# Patient Record
Sex: Female | Born: 1990 | Race: Black or African American | Hispanic: No | Marital: Single | Smoking: Never smoker
Health system: Southern US, Community
[De-identification: ages and names within clinical notes are randomized; demographics above are authoritative.]

## PROBLEM LIST (undated history)

## (undated) DIAGNOSIS — E669 Obesity, unspecified: Secondary | ICD-10-CM

## (undated) DIAGNOSIS — J069 Acute upper respiratory infection, unspecified: Secondary | ICD-10-CM

## (undated) HISTORY — PX: WISDOM TOOTH EXTRACTION: SHX21

## (undated) HISTORY — DX: Obesity, unspecified: E66.9

## (undated) HISTORY — DX: Acute upper respiratory infection, unspecified: J06.9

---

## 2011-08-27 ENCOUNTER — Other Ambulatory Visit: Payer: Self-pay | Admitting: Family Medicine

## 2011-08-27 ENCOUNTER — Ambulatory Visit
Admission: RE | Admit: 2011-08-27 | Discharge: 2011-08-27 | Disposition: A | Discharge status: Home / Self Care | Payer: PRIVATE HEALTH INSURANCE | Source: Ambulatory Visit | Attending: Family Medicine | Admitting: Family Medicine

## 2011-08-27 DIAGNOSIS — R109 Unspecified abdominal pain: Secondary | ICD-10-CM

## 2012-12-07 ENCOUNTER — Emergency Department (HOSPITAL_COMMUNITY): Payer: PRIVATE HEALTH INSURANCE

## 2012-12-07 ENCOUNTER — Encounter (HOSPITAL_COMMUNITY): Payer: Self-pay

## 2012-12-07 ENCOUNTER — Emergency Department (HOSPITAL_COMMUNITY)
Admission: EM | Admit: 2012-12-07 | Discharge: 2012-12-07 | Disposition: A | Discharge status: Home / Self Care | Payer: PRIVATE HEALTH INSURANCE | Attending: Emergency Medicine | Admitting: Emergency Medicine

## 2012-12-07 DIAGNOSIS — S99921A Unspecified injury of right foot, initial encounter: Secondary | ICD-10-CM

## 2012-12-07 DIAGNOSIS — S8990XA Unspecified injury of unspecified lower leg, initial encounter: Secondary | ICD-10-CM | POA: Insufficient documentation

## 2012-12-07 DIAGNOSIS — Y9389 Activity, other specified: Secondary | ICD-10-CM | POA: Insufficient documentation

## 2012-12-07 DIAGNOSIS — Y9289 Other specified places as the place of occurrence of the external cause: Secondary | ICD-10-CM | POA: Insufficient documentation

## 2012-12-07 DIAGNOSIS — W230XXA Caught, crushed, jammed, or pinched between moving objects, initial encounter: Secondary | ICD-10-CM | POA: Insufficient documentation

## 2012-12-07 DIAGNOSIS — R229 Localized swelling, mass and lump, unspecified: Secondary | ICD-10-CM | POA: Insufficient documentation

## 2012-12-07 NOTE — ED Notes (Signed)
Pt presents with no acute distress.  Pt c/o rt foot pain.  Pt reports rt foot hit fence last night. Pt has no deformity just slight swelling

## 2012-12-07 NOTE — ED Provider Notes (Signed)
History     CSN: [id number]  Arrival date & time 12/07/12  1157   First MD Initiated Contact with Patient 12/07/12 1211      Chief Complaint  Patient presents with  . Foot Pain  . Foot Injury    (Consider location/radiation/quality/duration/timing/severity/associated sxs/prior treatment) Patient is a 22 y.o. female presenting with lower extremity pain and foot injury. The history is provided by the patient.  Foot Pain This is a new problem. The current episode started yesterday (Pt was leaving through a gated fence and her foot got caught between the gate and fence at the heel). The problem occurs constantly. The problem has been unchanged. Associated symptoms include arthralgias. Pertinent negatives include no chills, diaphoresis, fatigue, fever, joint swelling, nausea, numbness or weakness. The symptoms are aggravated by walking. She has tried ice for the symptoms.  Foot Injury Associated symptoms: no fatigue and no fever     History reviewed. No pertinent past medical history.  Past Surgical History  Procedure Laterality Date  . Wisdom tooth extraction      No family history on file.  History  Substance Use Topics  . Smoking status: Never Smoker   . Smokeless tobacco: Not on file  . Alcohol Use: No    OB History   Grav Para Term Preterm Abortions TAB SAB Ect Mult Living                  Review of Systems  Constitutional: Negative for fever, chills, diaphoresis and fatigue.  Gastrointestinal: Negative for nausea.  Musculoskeletal: Positive for arthralgias. Negative for joint swelling.       Pain to right foot, back of heel  Neurological: Negative for weakness and numbness.    Allergies  Review of patient's allergies indicates no known allergies.  Home Medications  No current outpatient prescriptions on file.  BP 116/71  Pulse 88  Temp(Src) 98.9 F (37.2 C) (Oral)  Resp 18  Wt 270 lb (122.471 kg)  SpO2 100%  LMP 11/29/2012  Physical Exam  Nursing  note and vitals reviewed. Constitutional: She is oriented to person, place, and time. She appears well-developed and well-nourished. No distress.  HENT:  Head: Normocephalic and atraumatic.  Eyes: Conjunctivae and EOM are normal.  Neck: Normal range of motion. Neck supple.  No meningeal signs  Cardiovascular: Normal rate, regular rhythm, normal heart sounds and intact distal pulses.  Exam reveals no gallop and no friction rub.   No murmur heard. Pulmonary/Chest: Effort normal and breath sounds normal. No respiratory distress. She has no wheezes. She has no rales. She exhibits no tenderness.  Abdominal: Soft. Bowel sounds are normal. She exhibits no distension. There is no tenderness. There is no rebound and no guarding.  Musculoskeletal: Normal range of motion. She exhibits no edema and no tenderness.  FROM to right ankle including plantar flexion and dorsiflexion.     Neurological: She is alert and oriented to person, place, and time. No cranial nerve deficit.  Sensation normal to light touch Normal gait and balance Normal strength in injured extremity  Skin: Skin is warm and dry. She is not diaphoretic. No erythema.  Swelling to right achilles   Psychiatric: She has a normal mood and affect.    ED Course  Procedures (including critical care time)  Labs Reviewed - No data to display Dg Ankle Complete Right  12/07/2012   *RADIOLOGY REPORT*  Clinical Data: Right ankle pain.  RIGHT ANKLE - COMPLETE 3+ VIEW  Comparison: No priors.  Findings: Extensive soft tissue swelling is noted overlying the lateral malleolus.  No underlying acute displaced fracture, subluxation or dislocation is noted.  IMPRESSION: 1.  Soft tissue swelling overlying the lateral malleolus without definite underlying bony trauma.   Original Report Authenticated By: Trudie Reed, M.D.   Dg Foot Complete Right  12/07/2012   *RADIOLOGY REPORT*  Clinical Data: Foot pain post injury  RIGHT FOOT COMPLETE - 3+ VIEW   Comparison: None.  Findings: Three views of the right foot submitted.  No acute fracture or subluxation.  No radiopaque foreign body.  IMPRESSION: No acute fracture or subluxation.   Original Report Authenticated By: Natasha Mead, M.D.     1. Right foot injury, initial encounter       MDM  Imaging shows no fracture. Directed pt to ice injury, take acetaminophen or ibuprofen for pain, and to elevate and rest the injury when possible. Discussed reasons to seek immediate care. Patient expresses understanding and agrees with plan.    Glade Nurse, PA-C 12/07/12 1637

## 2012-12-08 NOTE — ED Provider Notes (Signed)
Medical screening examination/treatment/procedure(s) were performed by non-physician practitioner and as supervising physician I was immediately available for consultation/collaboration.   Charles B. Sheldon, MD 12/08/12 0700 

## 2013-09-09 IMAGING — CT CT ABD-PELV W/O CM
3 of 4 series · 13 of 36 positions shown, 19 images · non-contrast
Comparison: None.

CLINICAL DATA: Right flank pain for 2 months.  Microhematuria.
789.00.

CT ABDOMEN AND PELVIS WITHOUT CONTRAST
TECHNIQUE: Multidetector CT imaging of the abdomen and pelvis was
performed following the standard protocol without intravenous
contrast.

[Series 3: renal stone · axial · 0.91mm/px · z∈[-326,-20]mm · 7 of 83 slices shown, 12 images]
[im 11/83  soft-tissue]
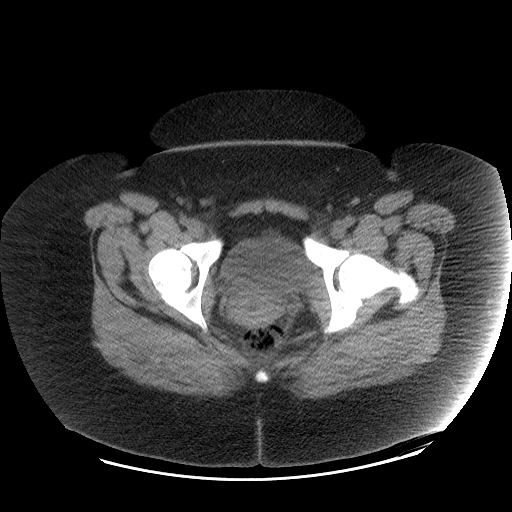
[im 11/83  bone]
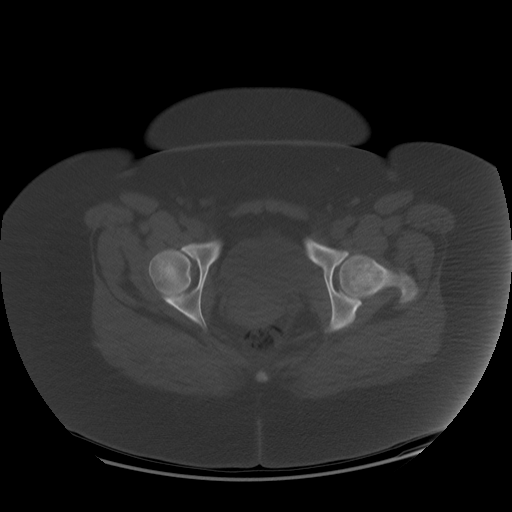
[im 21/83  soft-tissue]
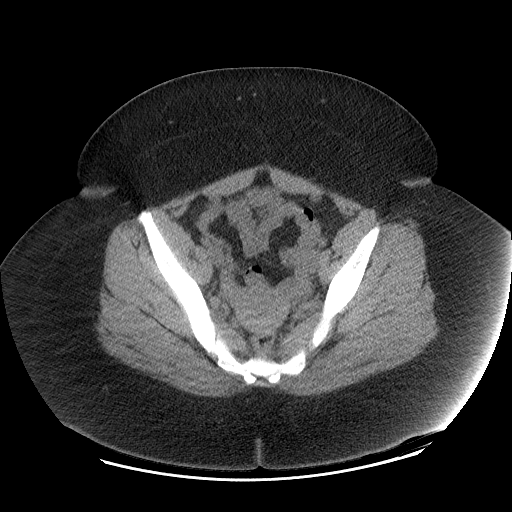
[im 31/83  soft-tissue]
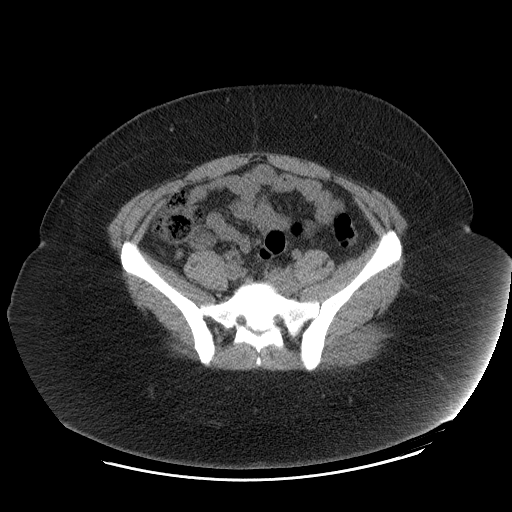
[im 42/83  soft-tissue]
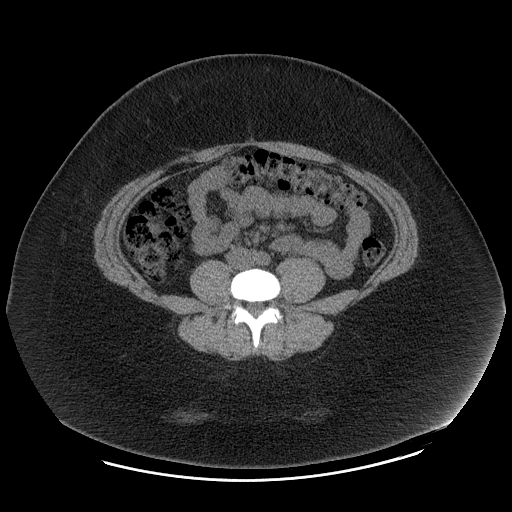
[im 42/83  lung]
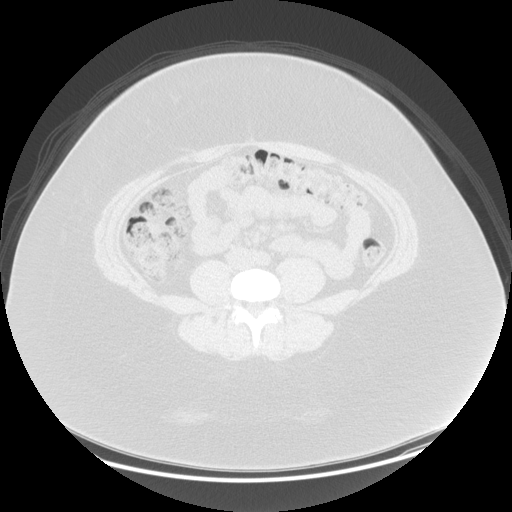
[im 52/83  soft-tissue]
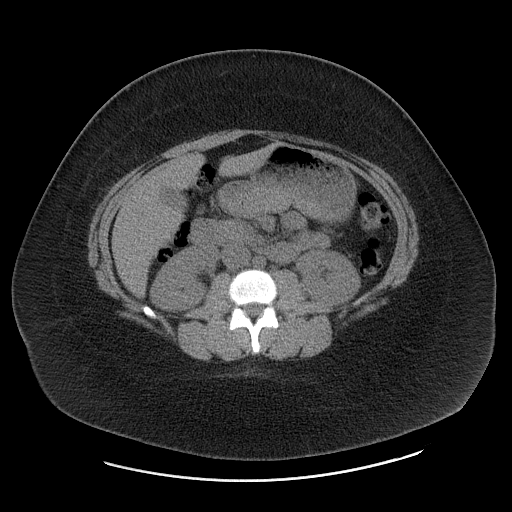
[im 52/83  lung]
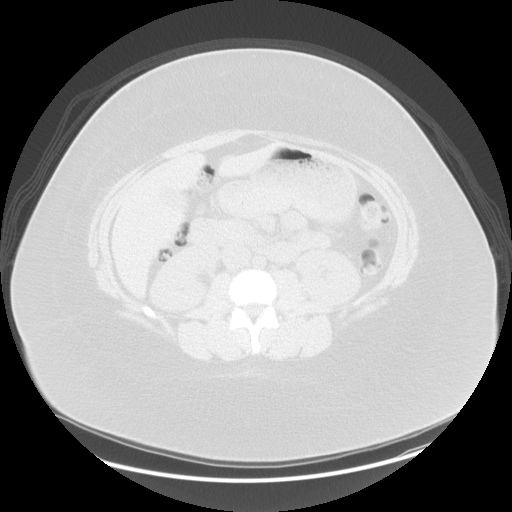
[im 62/83  soft-tissue]
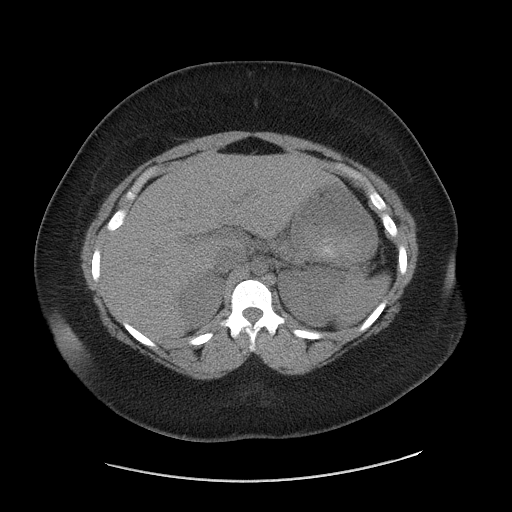
[im 62/83  lung]
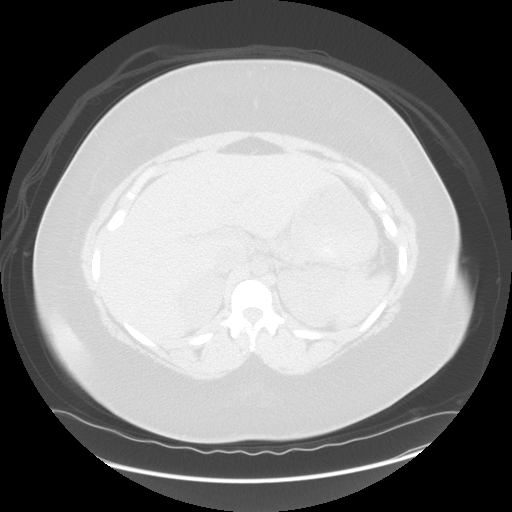
[im 72/83  soft-tissue]
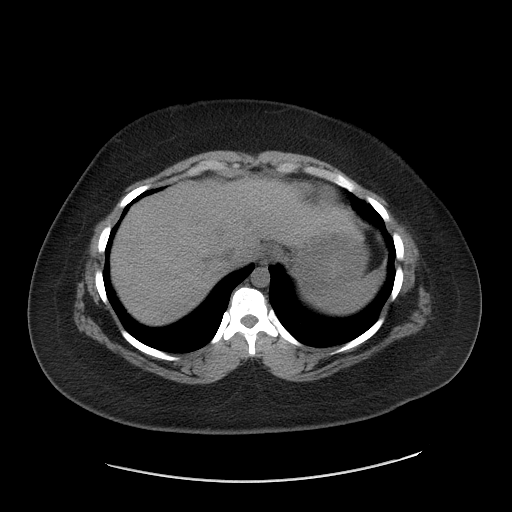
[im 72/83  lung]
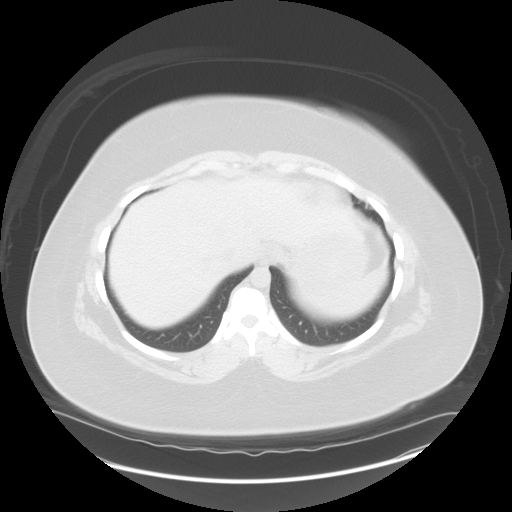

[Series 601: coronal body · coronal · 0.91mm/px · 1 of 156 slices shown, 2 images]
[im 52/156  soft-tissue]
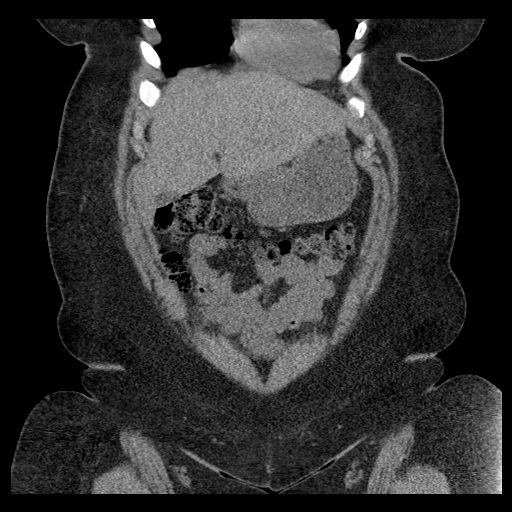
[im 52/156  bone]
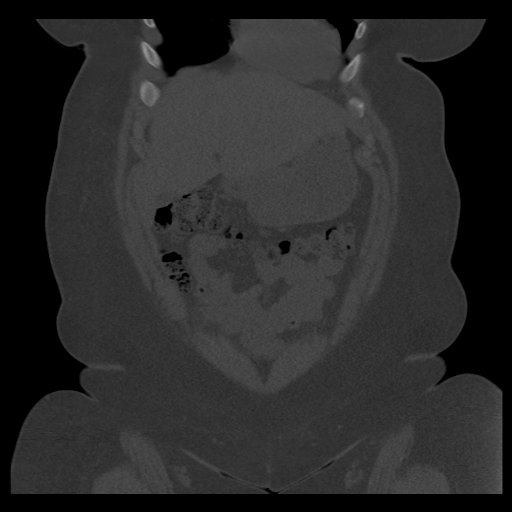

[Series 602: sagittal body · sagittal · 0.91mm/px · 5 of 187 slices shown]
[im 20/187  soft-tissue]
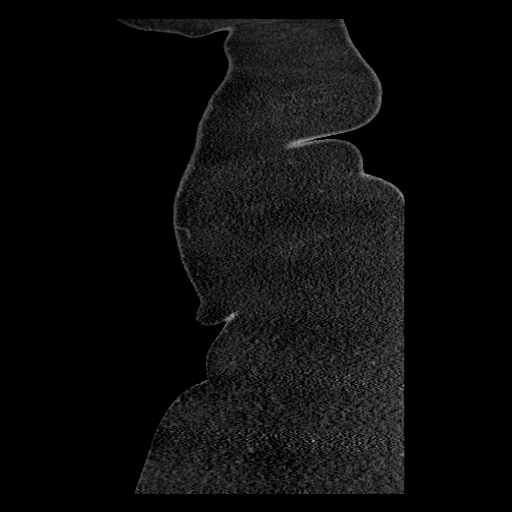
[im 40/187  soft-tissue]
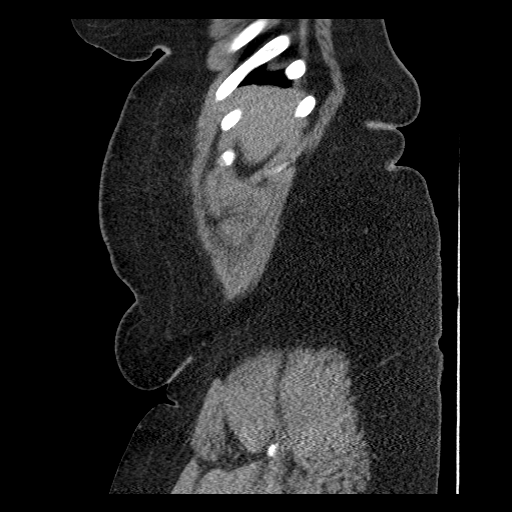
[im 59/187  soft-tissue]
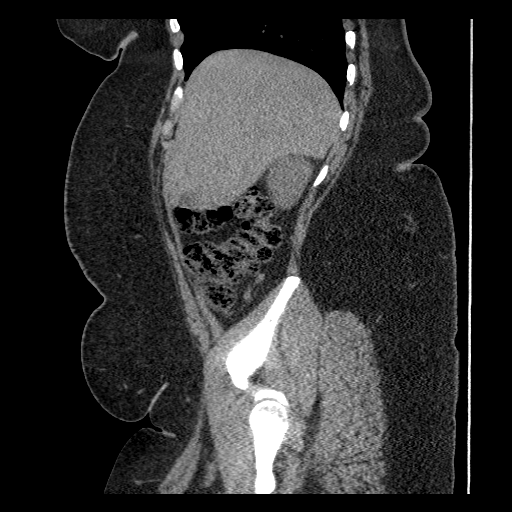
[im 79/187  soft-tissue]
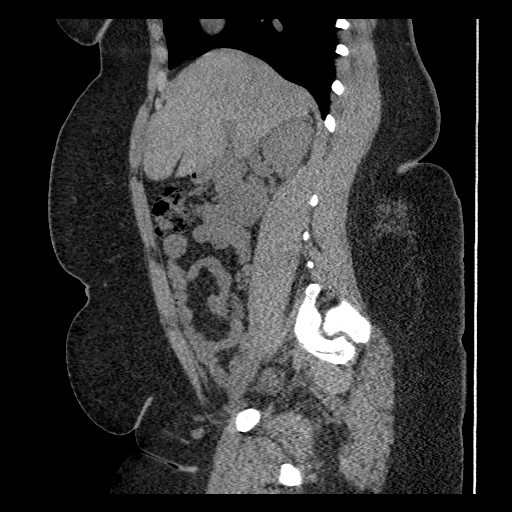
[im 108/187  soft-tissue]
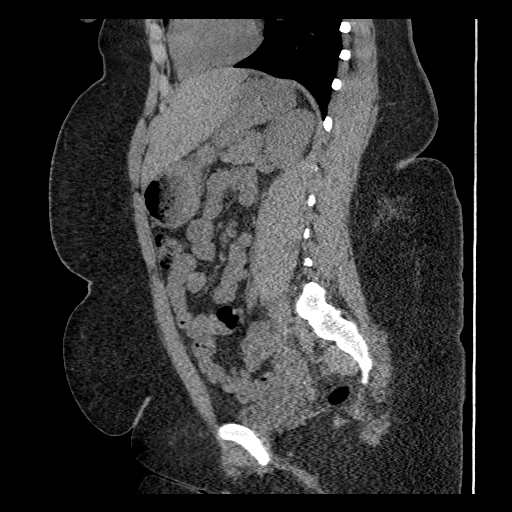

[13 of 36 positions shown; findings below may reference images not displayed]

FINDINGS: The kidneys are normal with no renal or ureteral calculi
or hydronephrosis.

The liver, spleen, pancreas, and adrenal glands are normal.  The
bowel is normal including the terminal ileum and appendix.  Uterus
is normal in size.  Ovaries are not discretely identified but there
is no evidence of a pelvic mass.  There is no free air or free
fluid in the abdomen.  Bones are normal.  Bladder is not distended.
IMPRESSION: Benign-appearing abdomen and pelvis.

## 2014-11-19 ENCOUNTER — Encounter (HOSPITAL_COMMUNITY): Payer: Self-pay | Admitting: Emergency Medicine

## 2014-11-19 ENCOUNTER — Emergency Department (HOSPITAL_COMMUNITY)
Admission: EM | Admit: 2014-11-19 | Discharge: 2014-11-19 | Disposition: A | Discharge status: Home / Self Care | Payer: PRIVATE HEALTH INSURANCE | Attending: Emergency Medicine | Admitting: Emergency Medicine

## 2014-11-19 DIAGNOSIS — Z791 Long term (current) use of non-steroidal anti-inflammatories (NSAID): Secondary | ICD-10-CM | POA: Insufficient documentation

## 2014-11-19 DIAGNOSIS — M542 Cervicalgia: Secondary | ICD-10-CM | POA: Diagnosis present

## 2014-11-19 DIAGNOSIS — M436 Torticollis: Secondary | ICD-10-CM | POA: Insufficient documentation

## 2014-11-19 MED ORDER — TRAMADOL HCL 50 MG PO TABS: 50.0000 mg | ORAL_TABLET | Freq: Four times a day (QID) | ORAL | Status: DC | PRN

## 2014-11-19 MED ORDER — IBUPROFEN 800 MG PO TABS: 800.0000 mg | ORAL_TABLET | Freq: Three times a day (TID) | ORAL | Status: DC

## 2014-11-19 NOTE — ED Notes (Signed)
Pt woke up two weeks ago with a "crick" in her neck. Stated she went to urgent care, got a shot of prednisone that helped for a short time. Now the pain in her neck has come back. Denies any injury to area.

## 2014-11-19 NOTE — Discharge Instructions (Signed)
Torticollis, Acute °You have suddenly (acutely) developed a twisted neck (torticollis). This is usually a self-limited condition. °CAUSES  °Acute torticollis may be caused by malposition, trauma or infection. Most commonly, acute torticollis is caused by sleeping in an awkward position. Torticollis may also be caused by the flexion, extension or twisting of the neck muscles beyond their normal position. Sometimes, the exact cause may not be known. °SYMPTOMS  °Usually, there is pain and limited movement of the neck. Your neck may twist to one side. °DIAGNOSIS  °The diagnosis is often made by physical examination. X-rays, CT scans or MRIs may be done if there is a history of trauma or concern of infection. °TREATMENT  °For a common, stiff neck that develops during sleep, treatment is focused on relaxing the contracted neck muscle. Medications (including shots) may be used to treat the problem. Most cases resolve in several days. Torticollis usually responds to conservative physical therapy. If left untreated, the shortened and spastic neck muscle can cause deformities in the face and neck. Rarely, surgery is required. °HOME CARE INSTRUCTIONS  °· Use over-the-counter and prescription medications as directed by your caregiver. °· Do stretching exercises and massage the neck as directed by your caregiver. °· Follow up with physical therapy if needed and as directed by your caregiver. °SEEK IMMEDIATE MEDICAL CARE IF:  °· You develop difficulty breathing or noisy breathing (stridor). °· You drool, develop trouble swallowing or have pain with swallowing. °· You develop numbness or weakness in the hands or feet. °· You have changes in speech or vision. °· You have problems with urination or bowel movements. °· You have difficulty walking. °· You have a fever. °· You have increased pain. °MAKE SURE YOU:  °· Understand these instructions. °· Will watch your condition. °· Will get help right away if you are not doing well or  get worse. °Document Released: 07/03/2000 Document Revised: 09/28/2011 Document Reviewed: 08/14/2009 °ExitCare® Patient Information ©2015 [employer]. This information is not intended to replace advice given to you by your health care provider. Make sure you discuss any questions you have with your health care provider. ° ° °Emergency Department Resource Guide °1) Find a Doctor and Pay Out of Pocket °Although you won't have to find out who is covered by your insurance plan, it is a good idea to ask around and get recommendations. You will then need to call the office and see if the doctor you have chosen will accept you as a new patient and what types of options they offer for patients who are self-pay. Some doctors offer discounts or will set up payment plans for their patients who do not have insurance, but you will need to ask so you aren't surprised when you get to your appointment. ° °2) Contact Your Local Health Department °Not all health departments have doctors that can see patients for sick visits, but many do, so it is worth a call to see if yours does. If you don't know where your local health department is, you can check in your phone book. The CDC also has a tool to help you locate your state's health department, and many state websites also have listings of all of their local health departments. ° °3) Find a Walk-in Clinic °If your illness is not likely to be very severe or complicated, you may want to try a walk in clinic. These are popping up all over the country in pharmacies, drugstores, and shopping centers. They're usually staffed by nurse practitioners or   physician assistants that have been trained to treat common illnesses and complaints. They're usually fairly quick and inexpensive. However, if you have serious medical issues or chronic medical problems, these are probably not your best option. ° °No Primary Care Doctor: °- Call [facility] at  832-8000 - they can help you locate a  primary care doctor that  accepts your insurance, provides certain services, etc. °- Physician Referral Service- 1-800-533-3463 ° °Chronic Pain Problems: °Organization         Address  Phone   Notes  °[facility]  (336) 297-2271 Patients need to be referred by their primary care doctor.  ° °Medication Assistance: °Organization         Address  Phone   Notes  °[place] [facility] [address] °[place], [place] [address] (336) 641-8030 --Must be a resident of [place] °-- Must have NO insurance coverage whatsoever (no Medicaid/ Medicare, etc.) °-- The pt. MUST have a primary care doctor that directs their care regularly and follows them in the community °  °[facility]  (866) 331-1348   °[employer]  (888) 892-1162   ° °Agencies that provide inexpensive medical care: °Organization         Address  Phone   Notes  °[facility]  (336) 832-8035   °[facility]    (336) 832-7272   °[facility] [address] °[place], [place] [address] (336) 832-4777   °[facility] [address], °[place] (336) 271-4999   °[facility]    (336) 373-0678   °[facility]    (336) 272-1050   °[facility] ° [address], [place] Phone:  (336) 832-4444, Fax:  (336) 832-4440 Hours of Operation:  9 am - 6 pm, M-F.  Also accepts Medicaid/Medicare and self-pay.  °[facility] ° [address], [place] Phone: (336) 832-3150, Fax: (336) 832-3151. Hours of Operation:  8:30 am - 5:30 pm, M-F.  Also accepts Medicaid and self-pay.  °[facility] [place] [address], [place] Phone: (336) 878-6027   °[facility] [address], [place], [place] (336)723-1848, Ext. 123 Mondays & Thursdays: 7-9 AM.  First 15 patients are seen on a first come, first serve basis. °  ° °Medicaid-accepting [place]  Providers: ° °Organization         Address  Phone   Notes  °[facility] [address], [place] (336) 641-2100 Also accepts self-pay patients.  °[facility] [address], [place] ° (336) 856-9996   °[facility] [address], [place] (336) 288-8857   °[facility] [address] [place] [address], [place] (336) 299-7000   °Veita Bland [address], [place]  ° (336) 373-1557 Only accepts [place] Access Medicaid patients after they have their name applied to their card.  ° °Self-Pay (no insurance) in [place]: ° °Organization         Address  Phone   Notes  °Sickle Cell Patients, [facility] [address], [place] (336) 832-1970   °[facility] [address],  (336) 832-4400   °[facility] ° [address], [place] (336) 992-4800   °[facility]/Dr. Osei-Bonsu ° [address] [place] [address], [place] or [address], [place] (336) [phone] Phone number for both [place] and [place] locations is the same.  °[facility] [address], [place] (336) 299-0000   °[facility] [address] [place] [address], [place] or [address] (336) 852-7530 °(336) 878-2260   °[facility] [address], [place] (336) 350-1642, phone; (336) 294-5005, fax Sees patients 1st and 3rd Saturday of every month.  Must not qualify for public or private insurance (i.e. Medicaid, Medicare, [place] Health Choice, Veterans' Benefits) • Household income should be no more than 200% of the poverty level •The clinic cannot treat you if you are pregnant or think you are pregnant • Sexually transmitted diseases are not treated at the clinic.  ° ° °Dental Care: °Organization         Address  Phone  Notes  °[facility] [address], [place] (336) 641-6152 Accepts children up to age 21 who are enrolled in Medicaid or [place] Health Choice; pregnant women with a Medicaid card; and children who have applied for Medicaid or [place] Health Choice, but were declined, whose parents can pay a reduced fee at time of service.  °[facility] [place]  [address], [place] (336) 641-7733 Accepts children up to age 21 who are enrolled in Medicaid or [place] Health Choice; pregnant women with a Medicaid card; and children who have applied for Medicaid or [place] Health Choice, but were declined, whose parents can pay a reduced fee at time of service.  °[facility] ° [address], [place] (336) 641-4533 Patients are seen by appointment only. Walk-ins are not accepted. [facility] will see patients 18 years of age and older. °Monday - Tuesday (8am-5pm) °Most Wednesdays (8:30-5pm) °$30 per visit, cash only  °[facility] ° [address], [place] (336) 641-4533 Patients are seen by appointment only. Walk-ins are not accepted. [facility] will see patients 18 years of age and older. °One Wednesday Evening (Monthly: Volunteer Based).  $30 per visit, cash only  °[facility]  (919) 537-3737 for adults; Children under age 4, call Graduate Pediatric Dentistry at (919) [phone]. Children aged 4-14, please call (919) [phone] to request a pediatric application. ° Dental services are provided in all areas of dental care including fillings, crowns and bridges, complete and partial dentures, implants, gum treatment, root canals, and extractions. Preventive care is also provided. Treatment is provided to both adults and children. °Patients are selected via a lottery and there is often a waiting list. °  °[facility] [address], ° ° (336) 763-8833 [email or web address] °  °[facility]  [address], [place], [place] (336)723-1848, Ext. 123 Second and Fourth Thursday of each month, opens at 6:30 AM; Clinic ends at 9 AM.  Patients are seen on a first-come first-served basis, and a limited number are seen during each clinic.  ° °[facility] ° [address], [place], [place] (336) 723-7904   Eligibility Requirements °You must have lived in [place], [place], or [place] counties for at least the last three months. °  You cannot be eligible for state or federal sponsored healthcare insurance, including Veterans Administration, Medicaid, or Medicare. °  You generally cannot be eligible for healthcare insurance through your employer.  °  How   to apply: °Eligibility screenings are held every Tuesday and Wednesday afternoon from 1:00 pm until 4:00 pm. You do not need an appointment for the interview!  °[facility] [address], [place], [place] 336-631-2330   °[facility]  336-342-8273   °[facility]  336-703-3100   °[facility]  336-570-6415   ° °Behavioral Health Resources in the Community: °Intensive Outpatient Programs °Organization         Address  Phone  Notes  °[facility] [address], [place], [place] 336-878-6098   °[facility] [address], [place], [place] 336-832-9800   °[facility] [address], [place], [place] ° 336-882-2125   °[facility] [address],  °[place], [place] 1-800-853-5163 or 336-641-4981   °Substance Abuse Resources °Organization         Address  Phone  Notes  °[facility]  336-882-2125   °[facility]  336-784-9470   °[facility]  336-285-9073   °[facility]  336-845-3988   °Residential & Outpatient Substance Abuse Program  1-800-659-3381   °Psychological Services °Organization         Address  Phone  Notes  °[facility]  336- 832-9600    °[facility]  336- 378-7881   °[facility] [address], [place] 1-800-853-5163 or 336-641-4981   ° °Mobile Crisis Teams °Organization         Address  Phone  Notes  °[facility], Mobile Crisis Care Unit  1-877-626-1772   °Assertive °[facility] ° [address] [place], [place] 336-834-9664   °[facility] DeEsch [address] °[place] 336-554-5454   ° °Self-Help/Support Groups °Organization         Address  Phone             Notes  °[facility] - variety of support groups  336- 373-1402 Call for more information  °Narcotics Anonymous (NA), [facility] [address], °[place]  2 meetings at this location  ° °Residential Treatment Programs °Organization         Address  Phone  Notes  °[facility] [address],    °[place]  1-866-801-8205   °[facility] ° [address], [place], [place] 704-293-8524   °[facility] [address], [place] 336-845-3988 Admissions: 8am-3pm M-F  °[facility] [address]    °[place], [place] 336-841-1104   °[facility] [address], [place], [place] 336-379-7146   °[facility] [address]  °[place], [place] 336-285-9073   °[facility] - Intensive Outpatient [address], [place], [place] 336-852-3033   °[facility] [address]  °[place], [place] 1-877-615-2722 or 336-784-9470   °[facility] [address] [place], [place] 336-227-7417 Accepts Medicaid  °[facility] [address]  °[place] 1-800-659-3381 Substance Abuse/Addiction Treatment  ° °[place] Behavioral Health Resources °Organization         Address  Phone  Notes  °[facility]  (888) 581-9988   °Julie Brannon, PhD [address] [place], [place]   (336) 349-5553 or (336) 951-0000    ° Behavioral   [address] °[place],  (336) 349-4454   °[facility] [address],   [place], [place] (336) 342-8316 Insurance/Medicaid/sponsorship through [facility]  °[facility] [address]                                    [place], [place] (336) 342-8316 Therapy/tele-psych/case  °[facility] [address]  ° [place], [place] (336) 349-2233    °Dr. Arfeen  (336) 349-4544   °[facility] [place]  [employer] [place] [facility] 1) [address], [place] °2) [address], [place] °3)  [address], [place] (336) 349-3220 °(336) 342-7768 ° °(336) 342-8140   °[place] [facility] (336) 342-1394 or (336) 342-3537 (After Hours)    ° ° ° °

## 2014-11-19 NOTE — ED Provider Notes (Signed)
CSN: [id number]     Arrival date & time 11/19/14  1105 History   First MD Initiated Contact with Patient 11/19/14 1120     Chief Complaint  Patient presents with  . Neck Pain     (Consider location/radiation/quality/duration/timing/severity/associated sxs/prior Treatment) HPI Sonya Barton is a 24 year old female who presents the ER complaining of neck pain 2 weeks. Patient states she "slept funny" 2 weeks ago, and has since been having a tightness in the right side of her neck. Patient states she went to urgent care several days ago, received a shot of prednisone, which helped her pain. Patient states her neck has loose and somewhat, however she still having pain on that side of her neck. Patient states she is only attempted using 200 mg of ibuprofen per day for this pain. Patient denies fever, headache, blurred vision, dizziness, weakness, tingling sensation.  History reviewed. No pertinent past medical history. Past Surgical History  Procedure Laterality Date  . Wisdom tooth extraction     History reviewed. No pertinent family history. History  Substance Use Topics  . Smoking status: Never Smoker   . Smokeless tobacco: Not on file  . Alcohol Use: No   OB History    No data available     Review of Systems  Constitutional: Negative for fever.  Eyes: Negative for visual disturbance.  Respiratory: Negative for shortness of breath.   Cardiovascular: Negative for chest pain.  Gastrointestinal: Negative for nausea, vomiting and abdominal pain.  Genitourinary: Negative for dysuria.  Musculoskeletal: Positive for neck pain.  Skin: Negative for rash.  Neurological: Negative for dizziness, syncope, weakness and numbness.  Psychiatric/Behavioral: Negative.       Allergies  Review of patient's allergies indicates no known allergies.  Home Medications   Prior to Admission medications   Medication Sig Start Date End Date Taking? Authorizing Provider  ibuprofen (ADVIL,MOTRIN)  800 MG tablet Take 1 tablet (800 mg total) by mouth 3 (three) times daily. 11/19/14   Ladona Mow, PA-C  traMADol (ULTRAM) 50 MG tablet Take 1 tablet (50 mg total) by mouth every 6 (six) hours as needed. 11/19/14   Ladona Mow, PA-C   BP 129/73 mmHg  Pulse 85  Temp(Src) 98.4 F (36.9 C) (Oral)  Resp 18  SpO2 100% Physical Exam  Constitutional: She is oriented to person, place, and time. She appears well-developed and well-nourished. No distress.  HENT:  Head: Normocephalic and atraumatic.  Right Ear: Tympanic membrane normal.  Left Ear: Tympanic membrane normal.  Mouth/Throat: Uvula is midline, oropharynx is clear and moist and mucous membranes are normal. No oropharyngeal exudate, posterior oropharyngeal edema, posterior oropharyngeal erythema or tonsillar abscesses.  Eyes: Conjunctivae and EOM are normal. Pupils are equal, round, and reactive to light. Right eye exhibits no discharge. Left eye exhibits no discharge. No scleral icterus.  Neck: Trachea normal, normal range of motion and full passive range of motion without pain. Neck supple. No tracheal tenderness, no spinous process tenderness and no muscular tenderness present. No rigidity. No tracheal deviation, no edema, no erythema and normal range of motion present. No Brudzinski's sign and no Kernig's sign noted.    Pulmonary/Chest: Effort normal. No stridor. No respiratory distress.  Musculoskeletal: Normal range of motion.  Neurological: She is alert and oriented to person, place, and time. She has normal strength. No cranial nerve deficit or sensory deficit. She displays a negative Romberg sign. Coordination and gait normal. GCS eye subscore is 4. GCS verbal subscore is 5. GCS motor subscore is  6.  Patient fully alert, answering questions appropriately in full, clear sentences. Cranial nerves II through XII grossly intact. Motor strength 5 out of 5 in all major muscle groups of upper and lower extremities. Distal sensation intact.   Skin:  Skin is warm and dry. She is not diaphoretic.  Psychiatric: She has a normal mood and affect.  Nursing note and vitals reviewed.   ED Course  Procedures (including critical care time) Labs Review Labs Reviewed - No data to display  Imaging Review No results found.   EKG Interpretation None      MDM   Final diagnoses:  Torticollis    Patient here with some symptoms consistent with torticollis. Patient has not failed conservative therapies based on her description of what she has been trying to help with her discomfort. Neuro exam is benign. There is no concern for spinal injury, cervical radiculopathy. There are no meningeal signs and there is no concern for meningitis. We'll encourage the continuation of conservative therapies, strongly encouraged follow-up with a primary care physician, encouraged range of motion exercises. Discussed return precautions with patient, patient verbalizes understanding and agreement of this plan. Encouraged patient to call or return to the ER if any worsening symptoms or should she have any questions or concerns.  BP 129/73 mmHg  Pulse 85  Temp(Src) 98.4 F (36.9 C) (Oral)  Resp 18  SpO2 100%  Signed,  Ladona MowJoe Surena Welge, PA-C 11:40 AM     Ladona MowJoe Makya Phillis, PA-C 11/19/14 1141  Arby BarretteMarcy Pfeiffer, MD 11/22/14 [phone]

## 2014-12-21 IMAGING — CR DG ANKLE COMPLETE 3+V*R*
3 series · 3 of 3 positions shown · non-contrast
Comparison: No priors.

CLINICAL DATA: Right ankle pain.

RIGHT ANKLE - COMPLETE 3+ VIEW

[x ankle ap right]
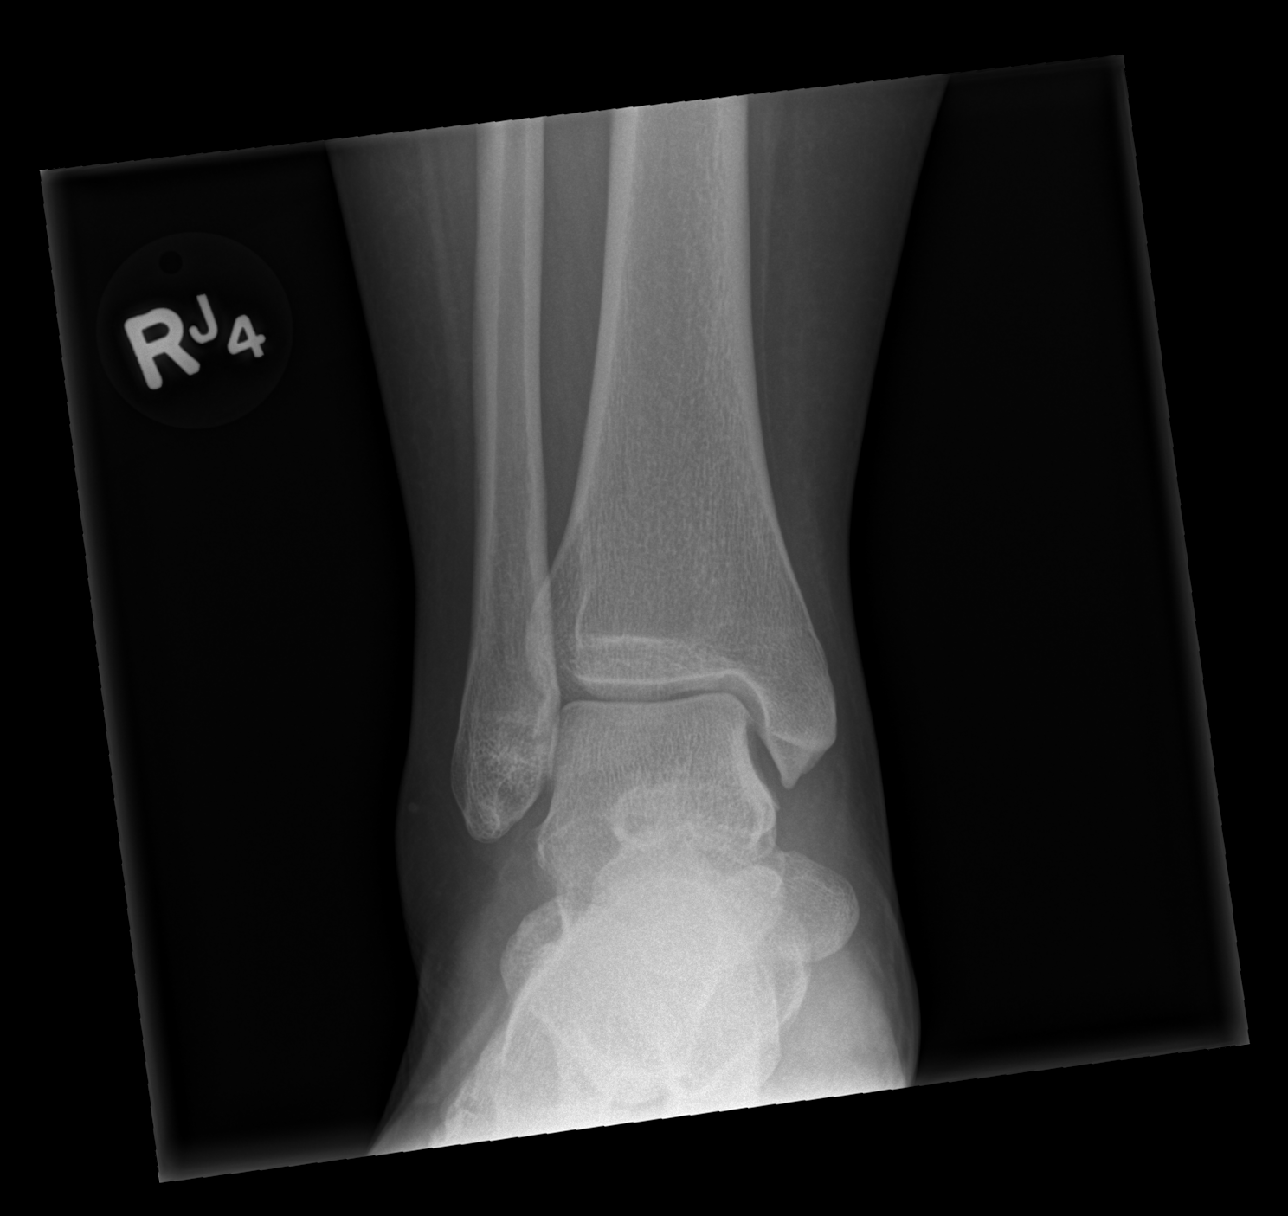

[x ankle obl right]
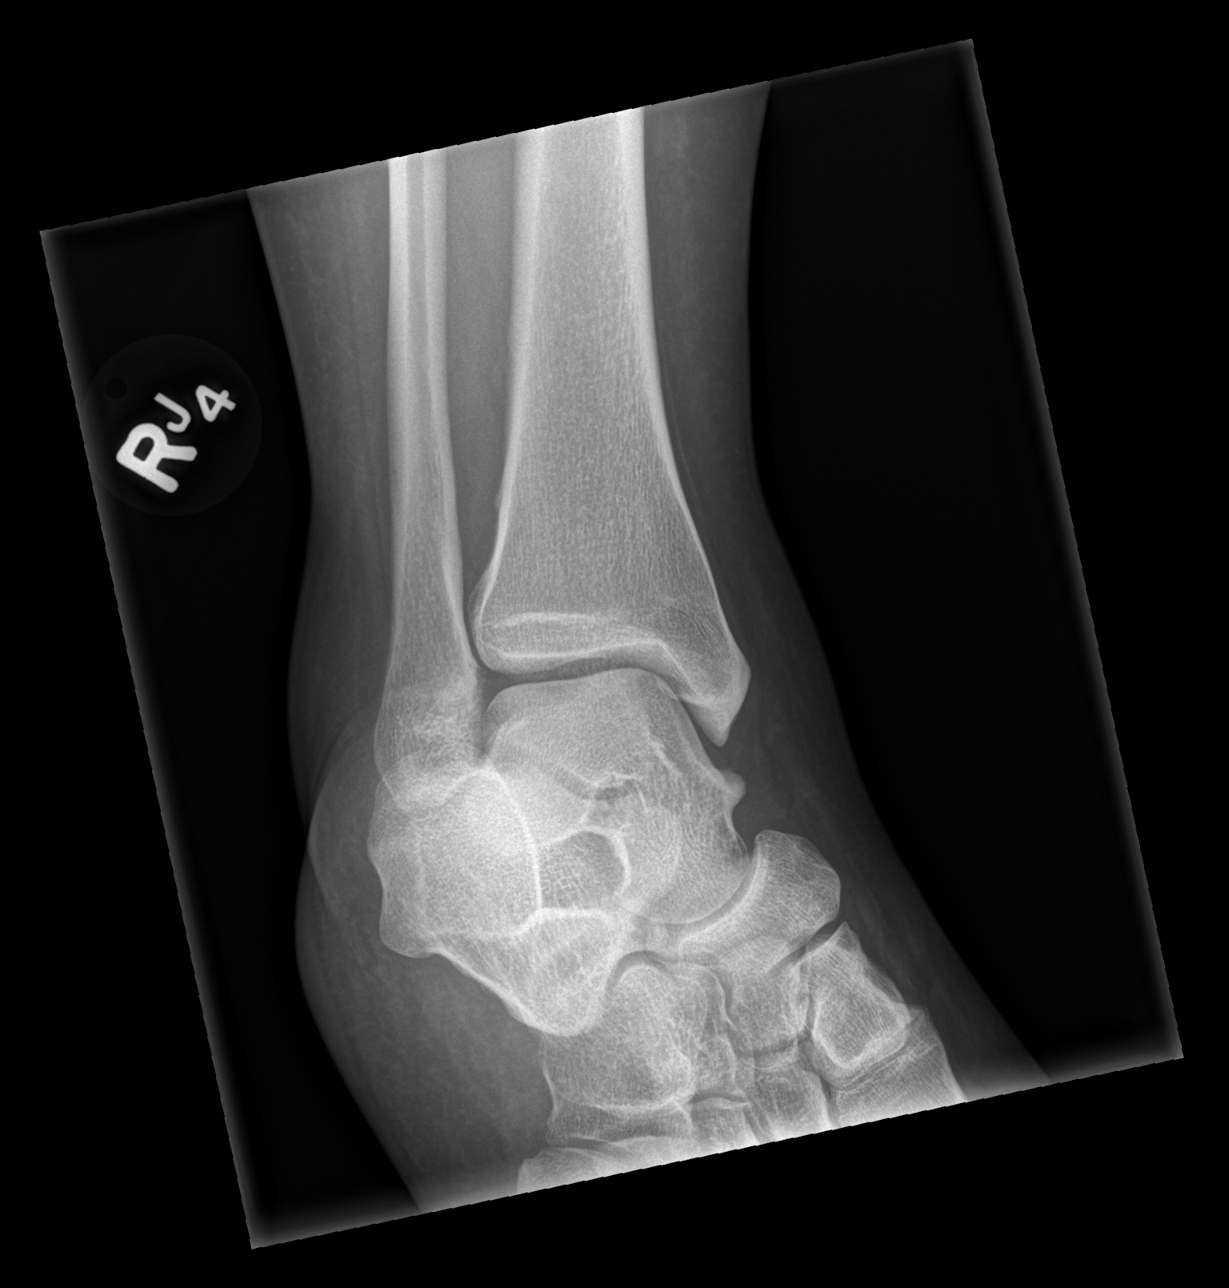

[x ankle lat right]
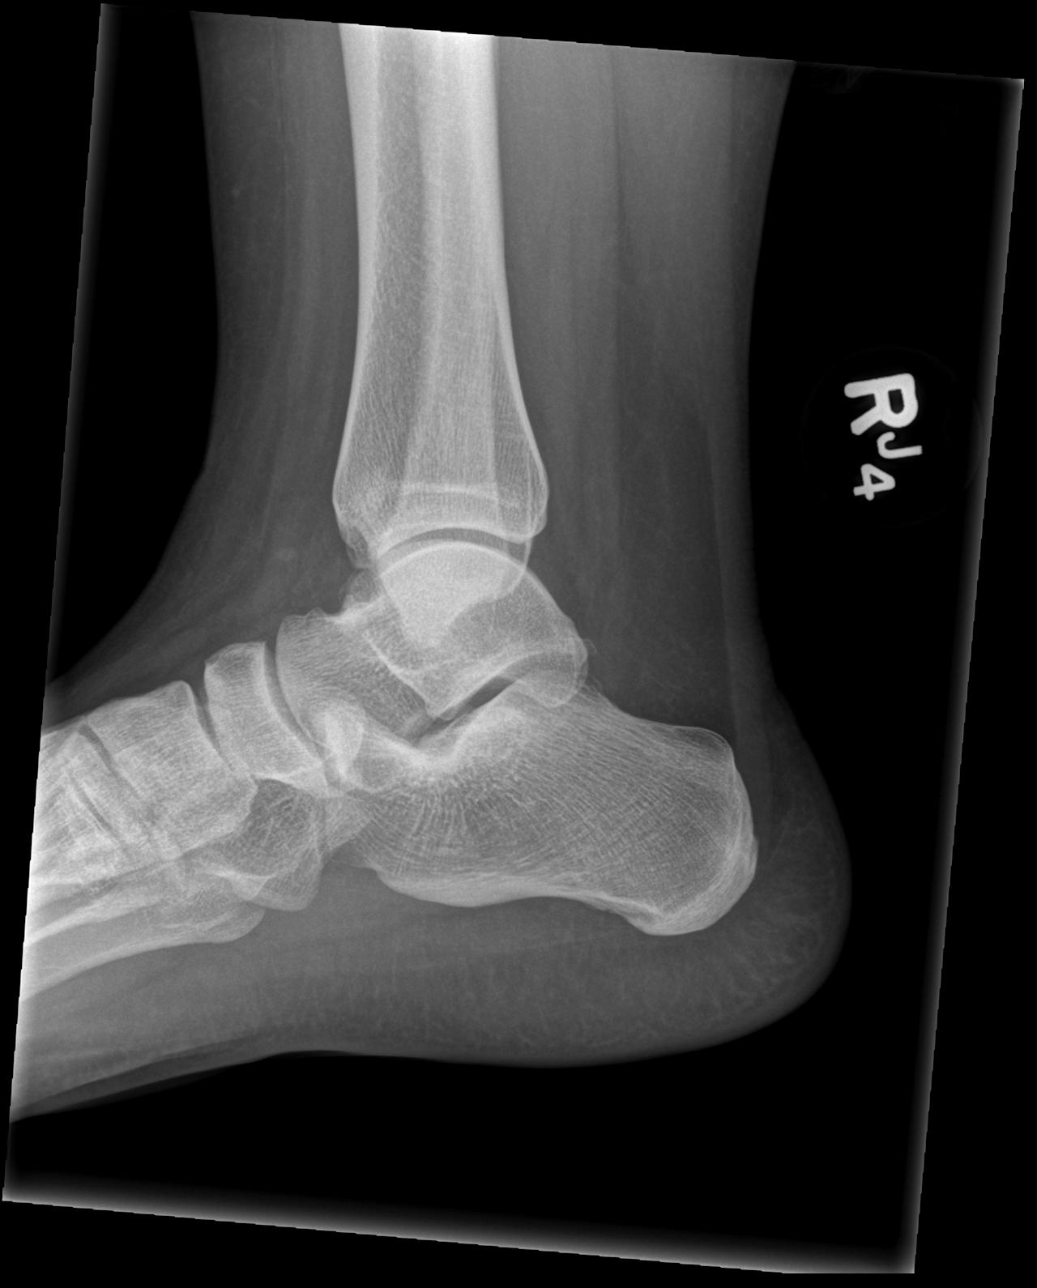

[3 of 3 positions shown; findings below may reference images not displayed]

FINDINGS: Extensive soft tissue swelling is noted overlying the
lateral malleolus.  No underlying acute displaced fracture,
subluxation or dislocation is noted.
IMPRESSION: 1.  Soft tissue swelling overlying the lateral malleolus without
definite underlying bony trauma.

## 2015-10-30 ENCOUNTER — Encounter (HOSPITAL_COMMUNITY): Payer: Self-pay | Admitting: *Deleted

## 2015-10-30 ENCOUNTER — Emergency Department (HOSPITAL_COMMUNITY)
Admission: EM | Admit: 2015-10-30 | Discharge: 2015-10-30 | Disposition: A | Discharge status: Home / Self Care | Payer: PRIVATE HEALTH INSURANCE | Attending: Emergency Medicine | Admitting: Emergency Medicine

## 2015-10-30 DIAGNOSIS — J029 Acute pharyngitis, unspecified: Secondary | ICD-10-CM | POA: Insufficient documentation

## 2015-10-30 DIAGNOSIS — Z791 Long term (current) use of non-steroidal anti-inflammatories (NSAID): Secondary | ICD-10-CM | POA: Insufficient documentation

## 2015-10-30 MED ORDER — PENICILLIN G BENZATHINE 1200000 UNIT/2ML IM SUSP
1.2000 10*6.[IU] | Freq: Once | INTRAMUSCULAR | Status: AC
  Administered 2015-10-30: 1.2 10*6.[IU] via INTRAMUSCULAR
  Filled 2015-10-30: qty 2

## 2015-10-30 NOTE — Discharge Instructions (Signed)
May use salt water gargles and/or chloraseptic spray to help with throat pain. May also take tylenol or motrin if needed for pain or fever. Return here for any new/worsening symptoms.

## 2015-10-30 NOTE — ED Notes (Signed)
C/O sore throat and cough x 2days.

## 2015-10-30 NOTE — ED Provider Notes (Signed)
CSN: [id number]     Arrival date & time 10/30/15  1111 History   First MD Initiated Contact with Patient 10/30/15 1120     Chief Complaint  Patient presents with  . Sore Throat     (Consider location/radiation/quality/duration/timing/severity/associated sxs/prior Treatment) Patient is a 25 y.o. female presenting with pharyngitis. The history is provided by the patient and medical records.  Sore Throat Associated symptoms include a sore throat.    25 year old female here with sore throat and dry cough for the past 2 days. Patient for she works at a [employer] and has likely had multiple sick contacts. She states cough is dry, nonproductive. She denies any fever or chills. No nausea, vomiting, or diarrhea. States it is very painful to swallow, therefore she is not eating and drinking as much as normal. She denies any difficulty swallowing. No chest pain or shortness of breath. Vital signs stable. No medication tried prior to arrival.  No past medical history on file. Past Surgical History  Procedure Laterality Date  . Wisdom tooth extraction     No family history on file. Social History  Substance Use Topics  . Smoking status: Never Smoker   . Smokeless tobacco: None  . Alcohol Use: No   OB History    No data available     Review of Systems  HENT: Positive for sore throat.   All other systems reviewed and are negative.     Allergies  Review of patient's allergies indicates no known allergies.  Home Medications   Prior to Admission medications   Medication Sig Start Date End Date Taking? Authorizing Provider  ibuprofen (ADVIL,MOTRIN) 800 MG tablet Take 1 tablet (800 mg total) by mouth 3 (three) times daily. 11/19/14   Ladona MowJoe Mintz, PA-C  traMADol (ULTRAM) 50 MG tablet Take 1 tablet (50 mg total) by mouth every 6 (six) hours as needed. 11/19/14   Ladona MowJoe Mintz, PA-C   BP 138/72 mmHg  Pulse 87  Temp(Src) 98.9 F (37.2 C)  Resp 18  SpO2 98%  LMP 10/30/2015   Physical  Exam  Constitutional: She is oriented to person, place, and time. She appears well-developed and well-nourished. No distress.  HENT:  Head: Normocephalic and atraumatic.  Right Ear: Tympanic membrane and ear canal normal.  Left Ear: Tympanic membrane and ear canal normal.  Nose: Nose normal.  Mouth/Throat: Uvula is midline and mucous membranes are normal. Posterior oropharyngeal erythema present.  Tonsils 2+ bilaterally with small exudates noted; uvula midline without evidence of peritonsillar abscess; handling secretions appropriately; no difficulty swallowing or speaking; normal phonation without stridor  Eyes: Conjunctivae and EOM are normal. Pupils are equal, round, and reactive to light.  Neck: Normal range of motion. Neck supple.  Cardiovascular: Normal rate, regular rhythm and normal heart sounds.   Pulmonary/Chest: Effort normal and breath sounds normal. No respiratory distress. She has no wheezes.  Musculoskeletal: Normal range of motion. She exhibits no edema.  Lymphadenopathy:    She has cervical adenopathy.       Right cervical: Superficial cervical adenopathy present.  Neurological: She is alert and oriented to person, place, and time.  Skin: Skin is warm and dry. She is not diaphoretic.  Psychiatric: She has a normal mood and affect.  Nursing note and vitals reviewed.   ED Course  Procedures (including critical care time) Labs Review Labs Reviewed - No data to display  Imaging Review No results found. I have personally reviewed and evaluated these images and lab results as  part of my medical decision-making.   EKG Interpretation None      MDM   Final diagnoses:  Sore throat   25 year old female here with sore throat and cough for 2 days. She is afebrile and nontoxic. Her tonsils are 2+ bilaterally with small exudates noted. She also has cervical lymphadenopathy. Patient's symptoms and physical exam findings are concerning for strep pharyngitis, will treat with  Bicillin in ED.  D/c home with supportive care, salt water gargles, chloraseptic spray.  Discussed plan with patient, he/she acknowledged understanding and agreed with plan of care.  Return precautions given for new or worsening symptoms.  Garlon Hatchet, PA-C 10/30/15 1202  Tilden Fossa, MD 10/31/15 [phone]

## 2022-03-30 ENCOUNTER — Ambulatory Visit: Payer: 59 | Admitting: Internal Medicine

## 2022-03-30 VITALS — BP 127/80 | HR 85 | Temp 98.7°F | Ht 62.0 in | Wt 358.7 lb

## 2022-03-30 DIAGNOSIS — E66813 Obesity, class 3: Secondary | ICD-10-CM

## 2022-03-30 DIAGNOSIS — E669 Obesity, unspecified: Secondary | ICD-10-CM | POA: Insufficient documentation

## 2022-03-30 DIAGNOSIS — M79605 Pain in left leg: Secondary | ICD-10-CM

## 2022-03-30 DIAGNOSIS — Z0000 Encounter for general adult medical examination without abnormal findings: Secondary | ICD-10-CM

## 2022-03-30 DIAGNOSIS — R002 Palpitations: Secondary | ICD-10-CM | POA: Diagnosis not present

## 2022-03-30 DIAGNOSIS — D509 Iron deficiency anemia, unspecified: Secondary | ICD-10-CM | POA: Diagnosis not present

## 2022-03-30 DIAGNOSIS — Z6841 Body Mass Index (BMI) 40.0 and over, adult: Secondary | ICD-10-CM

## 2022-03-30 DIAGNOSIS — Z309 Encounter for contraceptive management, unspecified: Secondary | ICD-10-CM

## 2022-03-30 LAB — POCT GLYCOSYLATED HEMOGLOBIN (HGB A1C): Hemoglobin A1C: 5.6 % (ref 4.0–5.6)

## 2022-03-30 LAB — GLUCOSE, CAPILLARY: Glucose-Capillary: 68 mg/dL — ABNORMAL LOW (ref 70–99)

## 2022-03-30 NOTE — Progress Notes (Unsigned)
Subjective:  CC: establish care  HPI:  [name] is a 31 y.o. female with a past medical history stated below and presents today to establish care. She has lived in [place] long term but has not had a PCP in several years. Please see problem based assessment and plan for additional details.  Past Medical History:  Diagnosis Date   Obesity     No current outpatient medications on file prior to visit.   No current facility-administered medications on file prior to visit.    Family History  Problem Relation Age of Onset   Hypertension Mother    Hypertension Father     Social History   Socioeconomic History   Marital status: Single    Spouse name: Not on file   Number of children: Not on file   Years of education: Not on file   Highest education level: Not on file  Occupational History   Occupation: receptionist  Tobacco Use   Smoking status: Never   Smokeless tobacco: Not on file  Substance and Sexual Activity   Alcohol use: No   Drug use: No   Sexual activity: Not on file  Other Topics Concern   Not on file  Social History Narrative   Not on file   Social Determinants of Health   Financial Resource Strain: Not on file  Food Insecurity: Not on file  Transportation Needs: Not on file  Physical Activity: Not on file  Stress: Not on file  Social Connections: Not on file  Intimate Partner Violence: Not on file   Sexual History: She previously took OCP but felt this lead to increased weight gain. She is not sexually active.  Review of Systems: ROS negative except for what is noted on the assessment and plan.  Objective:   Vitals:   03/30/22 1524 03/30/22 1627  BP: (!) 136/91 127/80  Pulse: 82 85  Temp: 98.7 F (37.1 C)   TempSrc: Oral   SpO2: 100%   Weight: (!) 358 lb 11.2 oz (162.7 kg)   Height: 5\' 2"  (1.575 m)     Physical Exam: Constitutional: well-appearing, in no acute distress Cardiovascular: regular rate and rhythm, no  m/r/g Pulmonary/Chest: normal work of breathing on room air, lungs clear to auscultation bilaterally Abdominal: soft, non-tender, non-distended Neurological: alert & oriented x 3 Skin: warm and dry Psych: normal mood and affect     Assessment & Plan:  Obesity Patient presenting to establish care.  She has previously followed with the weight management clinic earlier this spring but this was very expensive and she was not able to continue going.  Current BMI at 65.  A: Sonya Barton is motivated to lose weight.  She does not currently have a gym but would be willing to go if she had access to 1.  She did meet with a dietitian at the weight management clinic and found this to be helpful.  She is open to meeting with the posterior dietitian in clinic as well. P: Referral to registered dietitian Patient referral to exercise program Lipid panel Hemoglobin A1c  Iron deficiency anemia Patient reports prior history of iron deficiency anemia.  She previously took iron supplements but has not taken this in some time. A/P: CBC Iron, TIBC, ferritin  Health care maintenance Last Pap smear completed in August 2023.  We will request records from her OB/GYN.  She reports normal Pap and no prior abnormals.  Contraception management She previously took OCP, but felt this contributed to  weight gain. She is not sexually active.   Patient discussed with Dr. Marjorie Smolder Sonya Barton, D.O. [facility]  PGY-2 Pager: [phone]  Phone: [phone] Date 03/31/2022  Time 8:17 AM

## 2022-03-30 NOTE — Patient Instructions (Signed)
Thank you, [name] for allowing [place] to provide your care today.  Weight loss I think going to a dietician would be helpful for you. I referred you to Ms. Sonya Barton and the exercise program we talked about.  Labs: I am checking your cholesterol, for diabetes, blood counts with history of iron deficiency anemia, and your kidneys/ electrolytes.  Leg pain It seems like you had a pretty bad fall. I think what you are feeling is some blood that collected after your fall. It will take some time for this to resolve.   I have ordered the following labs for you:   Lab Orders         BMP8+Anion Gap         Lipid Profile         Iron, TIBC and Ferritin Panel         CBC no Diff         POC Hbg A1C      Referrals ordered today:   Referral Orders  No referral(s) requested today     I have ordered the following medication/changed the following medications:   Stop the following medications: Medications Discontinued During This Encounter  Medication Reason   traMADol (ULTRAM) 50 MG tablet Completed Course   ibuprofen (ADVIL,MOTRIN) 800 MG tablet Discontinued by provider     Start the following medications: No orders of the defined types were placed in this encounter.    Follow up: 6 months    We look forward to seeing you next time. Please call our clinic at [phone] if you have any questions or concerns. The best time to call is Monday-Friday from 9am-4pm, but there is someone available 24/7. If after hours or the weekend, call the main hospital number and ask for the Internal Medicine Resident On-Call. If you need medication refills, please notify your pharmacy one week in advance and they will send [place] a request.   Thank you for trusting me with your care. Wishing you the best!   Rudene Christians, DO [facility]

## 2022-03-31 ENCOUNTER — Encounter: Payer: Self-pay | Admitting: Internal Medicine

## 2022-03-31 DIAGNOSIS — Z309 Encounter for contraceptive management, unspecified: Secondary | ICD-10-CM | POA: Insufficient documentation

## 2022-03-31 LAB — CBC
Hematocrit: 37.1 % (ref 34.0–46.6)
Hemoglobin: 11.8 g/dL (ref 11.1–15.9)
MCH: 23.8 pg — ABNORMAL LOW (ref 26.6–33.0)
MCHC: 31.8 g/dL (ref 31.5–35.7)
MCV: 75 fL — ABNORMAL LOW (ref 79–97)
Platelets: 466 10*3/uL — ABNORMAL HIGH (ref 150–450)
RBC: 4.96 x10E6/uL (ref 3.77–5.28)
RDW: 14.4 % (ref 11.7–15.4)
WBC: 8.1 10*3/uL (ref 3.4–10.8)

## 2022-03-31 LAB — IRON,TIBC AND FERRITIN PANEL
Ferritin: 36 ng/mL (ref 15–150)
Iron Saturation: 13 % — ABNORMAL LOW (ref 15–55)
Iron: 55 ug/dL (ref 27–159)
Total Iron Binding Capacity: 432 ug/dL (ref 250–450)
UIBC: 377 ug/dL (ref 131–425)

## 2022-03-31 LAB — BMP8+ANION GAP
Anion Gap: 17 mmol/L (ref 10.0–18.0)
BUN/Creatinine Ratio: 18 (ref 9–23)
BUN: 11 mg/dL (ref 6–20)
CO2: 20 mmol/L (ref 20–29)
Calcium: 9.5 mg/dL (ref 8.7–10.2)
Chloride: 101 mmol/L (ref 96–106)
Creatinine, Ser: 0.61 mg/dL (ref 0.57–1.00)
Glucose: 74 mg/dL (ref 70–99)
Potassium: 4 mmol/L (ref 3.5–5.2)
Sodium: 138 mmol/L (ref 134–144)
eGFR: 123 mL/min/{1.73_m2} (ref >59)

## 2022-03-31 LAB — LIPID PANEL
Chol/HDL Ratio: 3.6 ratio (ref 0.0–4.4)
Cholesterol, Total: 175 mg/dL (ref 100–199)
HDL: 48 mg/dL (ref >39)
LDL Chol Calc (NIH): 116 mg/dL — ABNORMAL HIGH (ref 0–99)
Triglycerides: 56 mg/dL (ref 0–149)
VLDL Cholesterol Cal: 11 mg/dL (ref 5–40)

## 2022-03-31 NOTE — Assessment & Plan Note (Signed)
She previously took OCP, but felt this contributed to weight gain. She is not sexually active.

## 2022-03-31 NOTE — Assessment & Plan Note (Signed)
Patient reports prior history of iron deficiency anemia.  She previously took iron supplements but has not taken this in some time. A/P: CBC Iron, TIBC, ferritin

## 2022-03-31 NOTE — Assessment & Plan Note (Signed)
Patient presenting to establish care.  She has previously followed with the weight management clinic earlier this spring but this was very expensive and she was not able to continue going.  Current BMI at 65.  A: Sonya Barton is motivated to lose weight.  She does not currently have a gym but would be willing to go if she had access to 1.  She did meet with a dietitian at the weight management clinic and found this to be helpful.  She is open to meeting with the posterior dietitian in clinic as well. P: Referral to registered dietitian Patient referral to exercise program Lipid panel Hemoglobin A1c

## 2022-03-31 NOTE — Assessment & Plan Note (Signed)
Last Pap smear completed in August 2023.  We will request records from her OB/GYN.  She reports normal Pap and no prior abnormals.

## 2022-04-03 NOTE — Progress Notes (Signed)
Internal Medicine Clinic Attending  Case discussed with Dr. Masters  At the time of the visit.  We reviewed the resident's history and exam and pertinent patient test results.  I agree with the assessment, diagnosis, and plan of care documented in the resident's note.  

## 2022-05-18 ENCOUNTER — Encounter (INDEPENDENT_AMBULATORY_CARE_PROVIDER_SITE_OTHER): Payer: Self-pay

## 2022-06-02 ENCOUNTER — Telehealth: Payer: 59 | Admitting: Physician Assistant

## 2022-06-02 DIAGNOSIS — J302 Other seasonal allergic rhinitis: Secondary | ICD-10-CM

## 2022-06-02 DIAGNOSIS — R0982 Postnasal drip: Secondary | ICD-10-CM | POA: Diagnosis not present

## 2022-06-02 MED ORDER — FLUTICASONE PROPIONATE 50 MCG/ACT NA SUSP: 2.0000 | Freq: Every day | NASAL | 0 refills | Status: DC

## 2022-06-02 MED ORDER — CETIRIZINE HCL 10 MG PO TABS: 10.0000 mg | ORAL_TABLET | Freq: Every day | ORAL | 1 refills | Status: DC

## 2022-06-02 NOTE — Progress Notes (Signed)
Virtual Visit Consent   [name], you are scheduled for a virtual visit with a [facility] provider today. Just as with appointments in the office, your consent must be obtained to participate. Your consent will be active for this visit and any virtual visit you may have with one of our providers in the next 365 days. If you have a MyChart account, a copy of this consent can be sent to you electronically.  As this is a virtual visit, video technology does not allow for your provider to perform a traditional examination. This may limit your provider's ability to fully assess your condition. If your provider identifies any concerns that need to be evaluated in person or the need to arrange testing (such as labs, EKG, etc.), we will make arrangements to do so. Although advances in technology are sophisticated, we cannot ensure that it will always work on either your end or our end. If the connection with a video visit is poor, the visit may have to be switched to a telephone visit. With either a video or telephone visit, we are not always able to ensure that we have a secure connection.  By engaging in this virtual visit, you consent to the provision of healthcare and authorize for your insurance to be billed (if applicable) for the services provided during this visit. Depending on your insurance coverage, you may receive a charge related to this service.  I need to obtain your verbal consent now. Are you willing to proceed with your visit today? Sonya Barton has provided verbal consent on 06/02/2022 for a virtual visit (video or telephone). [name], [place]  Date: 06/02/2022 2:13 PM  Virtual Visit via Video Note   I, [name], connected with  Sonya Barton  ([id number], 09/20/1990) on 06/02/22 at  1:30 PM EST by a video-enabled telemedicine application and verified that I am speaking with the correct person using two identifiers.  Location: Patient: Virtual Visit  Location Patient: Mobile Provider: Virtual Visit Location Provider: Home Office   I discussed the limitations of evaluation and management by telemedicine and the availability of in person appointments. The patient expressed understanding and agreed to proceed.    History of Present Illness: Sonya Barton is a 31 y.o. who identifies as a female who was assigned female at birth, and is being seen today for post nasal drip over the past few days with runny nose.  Denies fever, chills, sinus pain, ear pain, tooth pain. Started advil allergy and sinus.   HPI: HPI  Problems:  Patient Active Problem List   Diagnosis Date Noted   Contraception management 03/31/2022   Iron deficiency anemia 03/30/2022   Obesity 03/30/2022   Health care maintenance 03/30/2022    Allergies: No Known Allergies Medications:  Current Outpatient Medications:    cetirizine (ZYRTEC) 10 MG tablet, Take 1 tablet (10 mg total) by mouth daily., Disp: 30 tablet, Rfl: 1   fluticasone (FLONASE) 50 MCG/ACT nasal spray, Place 2 sprays into both nostrils daily., Disp: 16 g, Rfl: 0  Observations/Objective: Patient is well-developed, well-nourished in no acute distress.  Resting comfortably. Head is normocephalic, atraumatic.  No labored breathing. Speech is clear and coherent with logical content.  Patient is alert and oriented at baseline.   Assessment and Plan: 1. Post-nasal drip - fluticasone (FLONASE) 50 MCG/ACT nasal spray; Place 2 sprays into both nostrils daily.  Dispense: 16 g; Refill: 0 - cetirizine (ZYRTEC) 10 MG tablet; Take 1 tablet (10  mg total) by mouth daily.  Dispense: 30 tablet; Refill: 1  2. Seasonal allergies - fluticasone (FLONASE) 50 MCG/ACT nasal spray; Place 2 sprays into both nostrils daily.  Dispense: 16 g; Refill: 0 - cetirizine (ZYRTEC) 10 MG tablet; Take 1 tablet (10 mg total) by mouth daily.  Dispense: 30 tablet; Refill: 1  Mild. Start Flonase and Cetrizine. Supportive measures reviewed.  Follow-up if not resolving.   Follow Up Instructions: I discussed the assessment and treatment plan with the patient. The patient was provided an opportunity to ask questions and all were answered. The patient agreed with the plan and demonstrated an understanding of the instructions.  A copy of instructions were sent to the patient via MyChart unless otherwise noted below.   The patient was advised to call back or seek an in-person evaluation if the symptoms worsen or if the condition fails to improve as anticipated.  Time:  I spent 10 minutes with the patient via telehealth technology discussing the above problems/concerns.    [name], PA-C

## 2022-06-02 NOTE — Patient Instructions (Signed)
Sonya Barton, thank you for joining [name], PA-C for today's virtual visit.  While this provider is not your primary care provider (PCP), if your PCP is located in our provider database this encounter information will be shared with them immediately following your visit.   A [facility] MyChart account gives you access to today's visit and all your visits, tests, and labs performed at [facility] " click here if you don't have a [facility] MyChart account or go to [email or web address]  Consent: (Patient) Sonya Barton provided verbal consent for this virtual visit at the beginning of the encounter.  Current Medications:  Current Outpatient Medications:    cetirizine (ZYRTEC) 10 MG tablet, Take 1 tablet (10 mg total) by mouth daily., Disp: 30 tablet, Rfl: 1   fluticasone (FLONASE) 50 MCG/ACT nasal spray, Place 2 sprays into both nostrils daily., Disp: 16 g, Rfl: 0   Medications ordered in this encounter:  Meds ordered this encounter  Medications   fluticasone (FLONASE) 50 MCG/ACT nasal spray    Sig: Place 2 sprays into both nostrils daily.    Dispense:  16 g    Refill:  0    Order Specific Question:   Supervising Provider    Answer:   Merrilee Jansky [[id number]]   cetirizine (ZYRTEC) 10 MG tablet    Sig: Take 1 tablet (10 mg total) by mouth daily.    Dispense:  30 tablet    Refill:  1    Order Specific Question:   Supervising Provider    Answer:   Merrilee Jansky [id number]     *If you need refills on other medications prior to your next appointment, please contact your pharmacy*  Follow-Up: Call back or seek an in-person evaluation if the symptoms worsen or if the condition fails to improve as anticipated.  [facility] [phone]  Other Instructions Allergic Rhinitis, Adult Allergic rhinitis is a reaction to allergens. Allergens are things that can cause an allergic reaction. This condition affects the lining inside the  nose (mucous membrane). There are two types of allergic rhinitis: Seasonal. This type is also called hay fever. It happens only during some times of the year. Perennial. This type can happen at any time of the year. This condition cannot be spread from person to person (is not contagious). It can be mild, worse, or very bad. It can develop at any age and may be outgrown. What are the causes? This condition may be caused by: Pollen from grasses, trees, and weeds. Dust mites. Smoke. Mold. Car fumes. The pee (urine), spit, or dander of pets. Dander is dead skin cells from a pet. What increases the risk? You are more likely to develop this condition if: You have allergies in your family. You have problems like allergies in your family. You may have: Swelling of parts of your eyes and eyelids. Asthma. This affects how you breathe. Long-term redness and swelling on your skin. Food allergies. What are the signs or symptoms? The main symptom of this condition is a runny or stuffy nose (nasal congestion). Other symptoms may include: Sneezing or coughing. Itching and tearing of your eyes. Mucus that drips down the back of your throat (postnasal drip). Trouble sleeping. Feeling tired. Headache. Sore throat. How is this treated? There is no cure for this condition. You should avoid things that you are allergic to. Treatment can help to relieve symptoms. This may include: Medicines that block allergy symptoms, such as corticosteroids  or antihistamines. These may be given as a shot, nasal spray, or pill. Avoiding things you are allergic to. Medicines that give you bits of what you are allergic to over time. This is called immunotherapy. It is done if other treatments do not help. You may get: Shots. Medicine under your tongue. Stronger medicines, if other treatments do not help. Follow these instructions at home: Avoiding allergens Find out what things you are allergic to and avoid them. To  do this, try these things: If you get allergies any time of year: Replace carpet with wood, tile, or vinyl flooring. Carpet can trap pet dander and dust. Do not smoke. Do not allow smoking in your home. Change your heating and air conditioning filters at least once a month. If you get allergies only some times of the year: Keep windows closed when you can. Plan things to do outside when pollen counts are lowest. Check pollen counts before you plan things to do outside. When you come indoors, change your clothes and shower before you sit on furniture or bedding. If you are allergic to a pet: Keep the pet out of your bedroom. Vacuum, sweep, and dust often. General instructions Take over-the-counter and prescription medicines only as told by your doctor. Drink enough fluid to keep your pee (urine) pale yellow. Keep all follow-up visits as told by your doctor. This is important. Where to find more information American Academy of Allergy, Asthma & Immunology: www.aaaai.org Contact a doctor if: You have a fever. You get a cough that does not go away. You make whistling sounds when you breathe (wheeze). Your symptoms slow you down. Your symptoms stop you from doing your normal things each day. Get help right away if: You are short of breath. This symptom may be an emergency. Do not wait to see if the symptom will go away. Get medical help right away. Call your local emergency services (911 in the [place] Do not drive yourself to the hospital. Summary Allergic rhinitis may be treated by taking medicines and avoiding things you are allergic to. If you have allergies only some of the year, keep windows closed when you can at those times. Contact your doctor if you get a fever or a cough that does not go away. This information is not intended to replace advice given to you by your health care provider. Make sure you discuss any questions you have with your health care provider. Document Revised:  12/18/2021 Document Reviewed: 07/04/2019 Elsevier Patient Education  [place].    If you have been instructed to have an in-person evaluation today at a local Urgent Care facility, please use the link below. It will take you to a list of all of our available [facility], including address, phone number and hours of operation. Please do not delay care.  [facility]  If you or a family member do not have a primary care provider, use the link below to schedule a visit and establish care. When you choose a [facility] primary care physician or advanced practice provider, you gain a long-term partner in health. Find a Primary Care Provider  Learn more about [facility]'s in-office and virtual care options: [facility]

## 2022-06-03 ENCOUNTER — Encounter: Payer: Self-pay | Admitting: Physician Assistant

## 2022-06-22 ENCOUNTER — Telehealth: Payer: Self-pay | Admitting: *Deleted

## 2022-06-22 NOTE — Telephone Encounter (Signed)
Contacted regarding [employer] referral. Left voice message requesting return call for more information. 

## 2022-08-08 ENCOUNTER — Telehealth: Payer: 59 | Admitting: Family Medicine

## 2022-08-08 DIAGNOSIS — Z20828 Contact with and (suspected) exposure to other viral communicable diseases: Secondary | ICD-10-CM | POA: Diagnosis not present

## 2022-08-08 DIAGNOSIS — R6889 Other general symptoms and signs: Secondary | ICD-10-CM | POA: Diagnosis not present

## 2022-08-08 MED ORDER — OSELTAMIVIR PHOSPHATE 75 MG PO CAPS: 75.0000 mg | ORAL_CAPSULE | Freq: Two times a day (BID) | ORAL | 0 refills | Status: AC

## 2022-08-08 MED ORDER — BENZONATATE 200 MG PO CAPS: 200.0000 mg | ORAL_CAPSULE | Freq: Two times a day (BID) | ORAL | 0 refills | Status: AC | PRN

## 2022-08-08 NOTE — Progress Notes (Signed)
Virtual Visit Consent   Sonya Barton, you are scheduled for a virtual visit with a Sonya Barton provider today. Just as with appointments in the office, your consent must be obtained to participate. Your consent will be active for this visit and any virtual visit you may have with one of our providers in the next 365 days. If you have a MyChart account, a copy of this consent can be sent to you electronically.  As this is a virtual visit, video technology does not allow for your provider to perform a traditional examination. This may limit your provider's ability to fully assess your condition. If your provider identifies any concerns that need to be evaluated in person or the need to arrange testing (such as labs, EKG, etc.), we will make arrangements to do so. Although advances in technology are sophisticated, we cannot ensure that it will always work on either your end or our end. If the connection with a video visit is poor, the visit may have to be switched to a telephone visit. With either a video or telephone visit, we are not always able to ensure that we have a secure connection.  By engaging in this virtual visit, you consent to the provision of healthcare and authorize for your insurance to be billed (if applicable) for the services provided during this visit. Depending on your insurance coverage, you may receive a charge related to this service.  I need to obtain your verbal consent now. Are you willing to proceed with your visit today? [name] has provided verbal consent on 08/08/2022 for a virtual visit (video or telephone). [name], FNP  Date: 08/08/2022 11:45 AM  Virtual Visit via Video Note   I, [name], connected with  Sonya Barton  ([id number], 11-Jun-1991) on 08/08/22 at 11:45 AM EST by a video-enabled telemedicine application and verified that I am speaking with the correct person using two identifiers.  Location: Patient: Virtual Visit Location Patient:  Home Provider: Virtual Visit Location Provider: Home Office   I discussed the limitations of evaluation and management by telemedicine and the availability of in person appointments. The patient expressed understanding and agreed to proceed.    History of Present Illness: Sonya Barton is a 32 y.o. 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 Fever, chills, body aches, headache, cough. No wheezing or sob. Covid test neg. Sx for 2 days persistent. Not pregnant .  HPI: HPI  Problems:  Patient Active Problem List   Diagnosis Date Noted   Contraception management 03/31/2022   Iron deficiency anemia 03/30/2022   Obesity 03/30/2022   Health care maintenance 03/30/2022    Allergies: No Known Allergies Medications:  Current Outpatient Medications:    benzonatate (TESSALON) 200 MG capsule, Take 1 capsule (200 mg total) by mouth 2 (two) times daily as needed for up to 10 days for cough., Disp: 20 capsule, Rfl: 0   oseltamivir (TAMIFLU) 75 MG capsule, Take 1 capsule (75 mg total) by mouth 2 (two) times daily for 5 days., Disp: 10 capsule, Rfl: 0   cetirizine (ZYRTEC) 10 MG tablet, Take 1 tablet (10 mg total) by mouth daily., Disp: 30 tablet, Rfl: 1   fluticasone (FLONASE) 50 MCG/ACT nasal spray, Place 2 sprays into both nostrils daily., Disp: 16 g, Rfl: 0  Observations/Objective: Patient is well-developed, well-nourished in no acute distress.  Resting comfortably  at home.  Head is normocephalic, atraumatic.  No labored breathing.  Speech is clear and coherent with logical content.  Patient is alert and oriented at baseline.    Assessment and Plan: 1. Flu-like symptoms  2. Exposure to influenza  Increase fluids, humidifier at night, tylenol or ibuprofen as directed, urgetn care if sx worsen.   Follow Up Instructions: I discussed the assessment and treatment plan with the patient. The  patient was provided an opportunity to ask questions and all were answered. The patient agreed with the plan and demonstrated an understanding of the instructions.  A copy of instructions were sent to the patient via MyChart unless otherwise noted below.     The patient was advised to call back or seek an in-person evaluation if the symptoms worsen or if the condition fails to improve as anticipated.  Time:  I spent 10 minutes with the patient via telehealth technology discussing the above problems/concerns.    [name], FNP

## 2022-08-08 NOTE — Patient Instructions (Signed)

## 2022-10-07 ENCOUNTER — Telehealth: Payer: 59 | Admitting: Nurse Practitioner

## 2022-10-07 ENCOUNTER — Telehealth: Payer: 59

## 2022-10-07 DIAGNOSIS — J069 Acute upper respiratory infection, unspecified: Secondary | ICD-10-CM

## 2022-10-07 DIAGNOSIS — J029 Acute pharyngitis, unspecified: Secondary | ICD-10-CM

## 2022-10-07 NOTE — Patient Instructions (Signed)
  Sonya Barton, thank you for joining Sonya Pretty, Sonya Barton for today's virtual visit.  While this provider is not your primary care provider (PCP), if your PCP is located in our provider database this encounter information will be shared with them immediately following your visit.   [facility] account gives you access to today's visit and all your visits, tests, and labs performed at [facility] " click here if you don't have a [facility] account or go to [email or web address]  Consent: (Patient) Sonya Barton provided verbal consent for this virtual visit at the beginning of the encounter.  Current Medications:  Current Outpatient Medications:    cetirizine (ZYRTEC) 10 MG tablet, Take 1 tablet (10 mg total) by mouth daily., Disp: 30 tablet, Rfl: 1   fluticasone (FLONASE) 50 MCG/ACT nasal spray, Place 2 sprays into both nostrils daily., Disp: 16 g, Rfl: 0   Medications ordered in this encounter:  No orders of the defined types were placed in this encounter.    *If you need refills on other medications prior to your next appointment, please contact your pharmacy*  Follow-Up: Call back or seek an in-person evaluation if the symptoms worsen or if the condition fails to improve as anticipated.  [facility] [phone]  Other Instructions 1. Take meds as prescribed 2. Use a cool mist humidifier especially during the winter months and when heat has been humid. 3. Use saline nose sprays frequently 4. Saline irrigations of the nose can be very helpful if done frequently.  * 4X daily for 1 week*  * Use of a nettie pot can be helpful with this. Follow directions with this* 5. Drink plenty of fluids 6. Keep thermostat turn down low 7.For any cough or congestion- delsym for dry cough, mucinex if need to cough something up 8. For fever or aces or pains- take tylenol or ibuprofen appropriate for age and weight.  * for fevers greater  than 101 orally you may alternate ibuprofen and tylenol every  3 hours.      If you have been instructed to have an in-person evaluation today at a local Urgent Care facility, please use the link below. It will take you to a list of all of our available [facility], including address, phone number and hours of operation. Please do not delay care.  [facility]  If you or a family member do not have a primary care provider, use the link below to schedule a visit and establish care. When you choose a [facility] primary care physician or advanced practice provider, you gain a long-term partner in health. Find a Primary Care Provider  Learn more about [facility] in-office and virtual care options: [facility]

## 2022-10-07 NOTE — Progress Notes (Signed)
Virtual Visit Consent   Sonya Barton, you are scheduled for a virtual visit with Mary-Margaret Hassell Done, FNP, a [facility] provider, today.     Just as with appointments in the office, your consent must be obtained to participate.  Your consent will be active for this visit and any virtual visit you may have with one of our providers in the next 365 days.     If you have a MyChart account, a copy of this consent can be sent to you electronically.  All virtual visits are billed to your insurance company just like a traditional visit in the office.    As this is a virtual visit, video technology does not allow for your provider to perform a traditional examination.  This may limit your provider's ability to fully assess your condition.  If your provider identifies any concerns that need to be evaluated in person or the need to arrange testing (such as labs, EKG, etc.), we will make arrangements to do so.     Although advances in technology are sophisticated, we cannot ensure that it will always work on either your end or our end.  If the connection with a video visit is poor, the visit may have to be switched to a telephone visit.  With either a video or telephone visit, we are not always able to ensure that we have a secure connection.     I need to obtain your verbal consent now.   Are you willing to proceed with your visit today? YES   Sonya Barton has provided verbal consent on 10/07/2022 for a virtual visit (video or telephone).   Mary-Margaret Hassell Done, FNP   Date: 10/07/2022 11:16 AM   Virtual Visit via Video Note   I, Mary-Margaret Hassell Done, connected with Sonya Barton ([id number], 01-18-1991) on 10/07/22 at 11:15 AM EDT by a video-enabled telemedicine application and verified that I am speaking with the correct person using two identifiers.  Location: Patient: Virtual Visit Location Patient: Other: at work Provider: Virtual Visit Location Provider: Mobile   I discussed the  limitations of evaluation and management by telemedicine and the availability of in person appointments. The patient expressed understanding and agreed to proceed.    History of Present Illness: Sonya Barton is a 32 y.o. who identifies as a female who was assigned female at birth, and is being seen today for uri.  HPI: URI  This is a new problem. The current episode started yesterday. The problem has been waxing and waning. There has been no fever. Associated symptoms include congestion, coughing, rhinorrhea and a sore throat (able to eat and drink). Pertinent negatives include no headaches. She has tried acetaminophen for the symptoms. The treatment provided moderate relief.    Review of Systems  Constitutional:  Negative for chills, fever and malaise/fatigue.  HENT:  Positive for congestion, rhinorrhea and sore throat (able to eat and drink).   Respiratory:  Positive for cough.   Neurological:  Negative for headaches.    Problems:  Patient Active Problem List   Diagnosis Date Noted   Contraception management 03/31/2022   Iron deficiency anemia 03/30/2022   Obesity 03/30/2022   Health care maintenance 03/30/2022    Allergies: No Known Allergies Medications:  Current Outpatient Medications:    cetirizine (ZYRTEC) 10 MG tablet, Take 1 tablet (10 mg total) by mouth daily., Disp: 30 tablet, Rfl: 1   fluticasone (FLONASE) 50 MCG/ACT nasal spray, Place 2 sprays into both nostrils daily., Disp:  16 g, Rfl: 0  Observations/Objective: Patient is well-developed, well-nourished in no acute distress.  Resting comfortably  at home.  Head is normocephalic, atraumatic.  No labored breathing.  Speech is clear and coherent with logical content.  Patient is alert and oriented at baseline.  Sore thorat  Assessment and Plan:  Sonya Barton in today with chief complaint of URI   1. URI with cough and congestion  2. Pharyngitis, unspecified etiology 1. Take meds as prescribed 2. Use a  cool mist humidifier especially during the winter months and when heat has been humid. 3. Use saline nose sprays frequently 4. Saline irrigations of the nose can be very helpful if done frequently.  * 4X daily for 1 week*  * Use of a nettie pot can be helpful with this. Follow directions with this* 5. Drink plenty of fluids 6. Keep thermostat turn down low 7.For any cough or congestion- delsym for dry cough, mucinex if need to couh up phlegm 8. For fever or aces or pains- take tylenol or ibuprofen appropriate for age and weight.  * for fevers greater than 101 orally you may alternate ibuprofen and tylenol every  3 hours.     Follow Up Instructions: I discussed the assessment and treatment plan with the patient. The patient was provided an opportunity to ask questions and all were answered. The patient agreed with the plan and demonstrated an understanding of the instructions.  A copy of instructions were sent to the patient via MyChart.  The patient was advised to call back or seek an in-person evaluation if the symptoms worsen or if the condition fails to improve as anticipated.  Time:  I spent 7 minutes with the patient via telehealth technology discussing the above problems/concerns.    Mary-Margaret Hassell Done, FNP

## 2022-10-09 ENCOUNTER — Other Ambulatory Visit: Payer: Self-pay | Admitting: Nurse Practitioner

## 2022-10-09 ENCOUNTER — Telehealth: Payer: 59

## 2022-10-09 MED ORDER — AMOXICILLIN 875 MG PO TABS: 875.0000 mg | ORAL_TABLET | Freq: Two times a day (BID) | ORAL | 0 refills | Status: DC

## 2022-10-09 NOTE — Progress Notes (Signed)
  Meds ordered this encounter  Medications   amoxicillin (AMOXIL) 875 MG tablet    Sig: Take 1 tablet (875 mg total) by mouth 2 (two) times daily. 1 po BID    Dispense:  14 tablet    Refill:  0    Order Specific Question:   Supervising Provider    Answer:   Worthy Rancher [id number]   [place], FNP

## 2022-10-21 NOTE — Progress Notes (Signed)
Subjective:    Sonya Barton - 32 y.o. female MRN [id number]  Date of birth: [birth year]  HPI  Sonya Barton is to establish care.   Current issues and/or concerns: - Intermittent chronic right knee pain x 1 year. Reports initially began after she fell while walking down the sidewalkand hit her knee on metal steps . She denies recent trauma/injury and red flag symptoms. She is not taking any over-the-counter medication to help per her preference. She would like to be seen by a specialist for evaluation.  - Reports she would like to lose 100 pounds. She does not monitor what she eats. She does not exercise outside of her normal routine. She was referred to a weight specialist in the past and reports she never heard from them. She would like a referral back to weight specialist.  - Requests to have blood sugar and cholesterol checked on today.  - No further issues/concerns for discussion today.     ROS per HPI    Health Maintenance:  Health Maintenance Due  Topic Date Due   COVID-19 Vaccine (1) Never done   HIV Screening  Never done   Hepatitis C Screening  Never done   DTaP/Tdap/Td (1 - Tdap) Never done     Past Medical History: Patient Active Problem List   Diagnosis Date Noted   Contraception management 03/31/2022   Iron deficiency anemia 03/30/2022   Obesity 03/30/2022   Health care maintenance 03/30/2022      Social History   reports that she has never smoked. She does not have any smokeless tobacco history on file. She reports that she does not drink alcohol and does not use drugs.   Family History  family history includes Hypertension in her father and mother.   Medications: reviewed and updated   Objective:   Physical Exam BP (!) 136/90 (BP Location: Right Arm, Patient Position: Sitting, Cuff Size: Large)   Pulse 96   Temp 98.7 F (37.1 C)   Resp 16   Ht 5\' 2"  (1.575 m)   Wt (!) 362 lb 12.8 oz (164.6 kg)   SpO2 98%   BMI 66.36 kg/m    Physical  Exam HENT:     Head: Normocephalic and atraumatic.  Eyes:     Extraocular Movements: Extraocular movements intact.     Conjunctiva/sclera: Conjunctivae normal.     Pupils: Pupils are equal, round, and reactive to light.  Cardiovascular:     Rate and Rhythm: Normal rate and regular rhythm.     Pulses: Normal pulses.     Heart sounds: Normal heart sounds.  Pulmonary:     Effort: Pulmonary effort is normal.     Breath sounds: Normal breath sounds.  Musculoskeletal:     Cervical back: Normal range of motion and neck supple.     Right hip: Normal.     Left hip: Normal.     Right upper leg: Normal.     Left upper leg: Normal.     Right knee: Normal.     Left knee: Normal.     Right lower leg: Normal.     Left lower leg: Normal.     Right ankle: Normal.     Left ankle: Normal.     Right foot: Normal.     Left foot: Normal.  Neurological:     General: No focal deficit present.     Mental Status: She is alert and oriented to person, place, and time.  Psychiatric:  Mood and Affect: Mood normal.        Behavior: Behavior normal.    Assessment & Plan:  1. Encounter to establish care - Patient presents today to establish care. During the interim follow-up with primary provider as scheduled.  - Return for annual physical examination, labs, and health maintenance. Arrive fasting meaning having no food for at least 8 hours prior to appointment. You may have only water or black coffee. Please take scheduled medications as normal.  2. Chronic pain of right knee - Patient declined pharmacological therapy.  - Referral to Orthopedics for further evaluation/management.  - Ambulatory referral to Orthopedics  3. Screening for metabolic disorder - Routine screening.  - Basic Metabolic Panel  4. Diabetes mellitus screening - Routine screening.  - Hemoglobin A1c  5. Screening cholesterol level - Routine screening.  - Lipid panel  6. Encounter for weight management 7. Class 3  severe obesity with body mass index (BMI) of 60.0 to 69.9 in adult, unspecified obesity type, unspecified whether serious comorbidity present 8. Weight gain - Referral to Medical Weight Management for further evaluation/management.  - Amb Ref to Medical Weight Management     Patient was given clear instructions to go to Emergency Department or return to medical center if symptoms don't improve, worsen, or new problems develop.The patient verbalized understanding.  I discussed the assessment and treatment plan with the patient. The patient was provided an opportunity to ask questions and all were answered. The patient agreed with the plan and demonstrated an understanding of the instructions.   The patient was advised to call back or seek an in-person evaluation if the symptoms worsen or if the condition fails to improve as anticipated.    Ricky Stabs, NP 10/30/2022, 3:36 PM Primary Care at [facility]

## 2022-10-30 ENCOUNTER — Encounter: Payer: Self-pay | Admitting: Family

## 2022-10-30 ENCOUNTER — Ambulatory Visit (INDEPENDENT_AMBULATORY_CARE_PROVIDER_SITE_OTHER): Payer: 59 | Admitting: Family

## 2022-10-30 VITALS — BP 136/90 | HR 96 | Temp 98.7°F | Resp 16 | Ht 62.0 in | Wt 362.8 lb

## 2022-10-30 DIAGNOSIS — Z131 Encounter for screening for diabetes mellitus: Secondary | ICD-10-CM

## 2022-10-30 DIAGNOSIS — Z6841 Body Mass Index (BMI) 40.0 and over, adult: Secondary | ICD-10-CM | POA: Diagnosis not present

## 2022-10-30 DIAGNOSIS — M25561 Pain in right knee: Secondary | ICD-10-CM | POA: Diagnosis not present

## 2022-10-30 DIAGNOSIS — G8929 Other chronic pain: Secondary | ICD-10-CM | POA: Diagnosis not present

## 2022-10-30 DIAGNOSIS — R635 Abnormal weight gain: Secondary | ICD-10-CM

## 2022-10-30 DIAGNOSIS — Z13228 Encounter for screening for other metabolic disorders: Secondary | ICD-10-CM | POA: Diagnosis not present

## 2022-10-30 DIAGNOSIS — Z13220 Encounter for screening for lipoid disorders: Secondary | ICD-10-CM

## 2022-10-30 DIAGNOSIS — Z7689 Persons encountering health services in other specified circumstances: Secondary | ICD-10-CM

## 2022-10-30 NOTE — Patient Instructions (Signed)
Thank you for choosing Primary Care at [facility] for your medical home!    Sonya Barton was seen by Rema Fendt, NP today.   Sonya Barton's primary care provider is Ricky Stabs, NP.   For the best care possible,  you should try to see Ricky Stabs, NP whenever you come to office.   We look forward to seeing you again soon!  If you have any questions about your visit today,  please call us at [phone]  Or feel free to reach your provider via MyChart.    Keeping you healthy   Get these tests Blood pressure- Have your blood pressure checked once a year by your healthcare provider.  Normal blood pressure is 120/80. Weight- Have your body mass index (BMI) calculated to screen for obesity.  BMI is a measure of body fat based on height and weight. You can also calculate your own BMI at [email or web address].west-esparza.[email or web address] Cholesterol- Have your cholesterol checked regularly starting at age 35, sooner may be necessary if you have diabetes, high blood pressure, if a family member developed heart diseases at an early age or if you smoke.  Chlamydia, HIV, and other sexual transmitted disease- Get screened each year until the age of 59 then within three months of each new sexual partner. Diabetes- Have your blood sugar checked regularly if you have high blood pressure, high cholesterol, a family history of diabetes or if you are overweight.   Get these vaccines Flu shot- Every fall. Tetanus shot- Every 10 years. Menactra- Single dose; prevents meningitis.   Take these steps Don't smoke- If you do smoke, ask your healthcare provider about quitting. For tips on how to quit, go to www.smokefree.gov or call 1-800-QUIT-NOW. Be physically active- Exercise 5 days a week for at least 30 minutes.  If you are not already physically active start slow and gradually work up to 30 minutes of moderate physical activity.  Examples of moderate activity include walking briskly, mowing the yard, dancing,  swimming bicycling, etc. Eat a healthy diet- Eat a variety of healthy foods such as fruits, vegetables, low fat milk, low fat cheese, yogurt, lean meats, poultry, fish, beans, tofu, etc.  For more information on healthy eating, go to www.thenutritionsource.org Drink alcohol in moderation- Limit alcohol intake two drinks or less a day.  Never drink and drive. Dentist- Brush and floss teeth twice daily; visit your dentis twice a year. Depression-Your emotional health is as important as your physical health.  If you're feeling down, losing interest in things you normally enjoy please talk with your healthcare provider. Gun Safety- If you keep a gun in your home, keep it unloaded and with the safety lock on.  Bullets should be stored separately. Helmet use- Always wear a helmet when riding a motorcycle, bicycle, rollerblading or skateboarding. Safe sex- If you may be exposed to a sexually transmitted infection, use a condom Seat belts- Seat bels can save your life; always wear one. Smoke/Carbon Monoxide detectors- These detectors need to be installed on the appropriate level of your home.  Replace batteries at least once a year. Skin Cancer- When out in the sun, cover up and use sunscreen SPF 15 or higher. Violence- If anyone is threatening or hurting you, please tell your healthcare provider.

## 2022-10-30 NOTE — Progress Notes (Signed)
Pt is here to est care   Complaining of right knee pain on and off for X1 year states she fell down and hit her knee on metal steps

## 2022-10-31 LAB — LIPID PANEL
Chol/HDL Ratio: 3.9 ratio (ref 0.0–4.4)
Cholesterol, Total: 185 mg/dL (ref 100–199)
HDL: 47 mg/dL (ref >39)
LDL Chol Calc (NIH): 127 mg/dL — ABNORMAL HIGH (ref 0–99)
Triglycerides: 58 mg/dL (ref 0–149)
VLDL Cholesterol Cal: 11 mg/dL (ref 5–40)

## 2022-10-31 LAB — BASIC METABOLIC PANEL
BUN/Creatinine Ratio: 19 (ref 9–23)
BUN: 11 mg/dL (ref 6–20)
CO2: 20 mmol/L (ref 20–29)
Calcium: 9.3 mg/dL (ref 8.7–10.2)
Chloride: 103 mmol/L (ref 96–106)
Creatinine, Ser: 0.57 mg/dL (ref 0.57–1.00)
Glucose: 77 mg/dL (ref 70–99)
Potassium: 4.3 mmol/L (ref 3.5–5.2)
Sodium: 139 mmol/L (ref 134–144)
eGFR: 125 mL/min/{1.73_m2} (ref >59)

## 2022-10-31 LAB — HEMOGLOBIN A1C
Est. average glucose Bld gHb Est-mCnc: 131 mg/dL
Hgb A1c MFr Bld: 6.2 % — ABNORMAL HIGH (ref 4.8–5.6)

## 2022-11-02 ENCOUNTER — Encounter: Payer: Self-pay | Admitting: Family

## 2022-11-02 DIAGNOSIS — R7303 Prediabetes: Secondary | ICD-10-CM | POA: Insufficient documentation

## 2022-11-11 ENCOUNTER — Encounter: Payer: Self-pay | Admitting: Family

## 2022-11-13 ENCOUNTER — Other Ambulatory Visit (INDEPENDENT_AMBULATORY_CARE_PROVIDER_SITE_OTHER): Payer: 59

## 2022-11-13 ENCOUNTER — Telehealth: Payer: 59 | Admitting: Physician Assistant

## 2022-11-13 ENCOUNTER — Other Ambulatory Visit: Payer: Self-pay

## 2022-11-13 ENCOUNTER — Encounter: Payer: Self-pay | Admitting: Physician Assistant

## 2022-11-13 ENCOUNTER — Encounter: Payer: Self-pay | Admitting: Sports Medicine

## 2022-11-13 ENCOUNTER — Ambulatory Visit (INDEPENDENT_AMBULATORY_CARE_PROVIDER_SITE_OTHER): Payer: 59 | Admitting: Sports Medicine

## 2022-11-13 DIAGNOSIS — G8929 Other chronic pain: Secondary | ICD-10-CM

## 2022-11-13 DIAGNOSIS — M25561 Pain in right knee: Secondary | ICD-10-CM

## 2022-11-13 DIAGNOSIS — J019 Acute sinusitis, unspecified: Secondary | ICD-10-CM

## 2022-11-13 DIAGNOSIS — B9689 Other specified bacterial agents as the cause of diseases classified elsewhere: Secondary | ICD-10-CM

## 2022-11-13 DIAGNOSIS — S8002XA Contusion of left knee, initial encounter: Secondary | ICD-10-CM

## 2022-11-13 MED ORDER — FLUTICASONE PROPIONATE 50 MCG/ACT NA SUSP: 2.0000 | Freq: Every day | NASAL | 0 refills | Status: DC

## 2022-11-13 MED ORDER — DOXYCYCLINE HYCLATE 100 MG PO TABS: 100.0000 mg | ORAL_TABLET | Freq: Two times a day (BID) | ORAL | 0 refills | Status: DC

## 2022-11-13 MED ORDER — MELOXICAM 15 MG PO TABS: 15.0000 mg | ORAL_TABLET | Freq: Every day | ORAL | 0 refills | Status: DC

## 2022-11-13 NOTE — Progress Notes (Signed)
Right knee pain for 1 year Larey Seat on metal steps Intermittent pain since Minimal swelling Denies OTC medication

## 2022-11-13 NOTE — Patient Instructions (Signed)
Sonya Barton, thank you for joining Sonya Loveless, PA-C for today's virtual visit.  While this provider is not your primary care provider (PCP), if your PCP is located in our provider database this encounter information will be shared with them immediately following your visit.   A [facility] MyChart account gives you access to today's visit and all your visits, tests, and labs performed at [facility] " click here if you don't have a [facility] MyChart account or go to [email or web address]  Consent: (Patient) Sonya Barton provided verbal consent for this virtual visit at the beginning of the encounter.  Current Medications:  Current Outpatient Medications:    doxycycline (VIBRA-TABS) 100 MG tablet, Take 1 tablet (100 mg total) by mouth 2 (two) times daily., Disp: 20 tablet, Rfl: 0   fluticasone (FLONASE) 50 MCG/ACT nasal spray, Place 2 sprays into both nostrils daily., Disp: 16 g, Rfl: 0   Medications ordered in this encounter:  Meds ordered this encounter  Medications   doxycycline (VIBRA-TABS) 100 MG tablet    Sig: Take 1 tablet (100 mg total) by mouth 2 (two) times daily.    Dispense:  20 tablet    Refill:  0    Order Specific Question:   Supervising Provider    Answer:   Sonya Barton [[id number]]   fluticasone (FLONASE) 50 MCG/ACT nasal spray    Sig: Place 2 sprays into both nostrils daily.    Dispense:  16 g    Refill:  0    Order Specific Question:   Supervising Provider    Answer:   Sonya Barton [id number]     *If you need refills on other medications prior to your next appointment, please contact your pharmacy*  Follow-Up: Call back or seek an in-person evaluation if the symptoms worsen or if the condition fails to improve as anticipated.  [facility] [phone]  Other Instructions Sinus Infection, Adult A sinus infection, also called sinusitis, is inflammation of your sinuses. Sinuses are hollow spaces in the  bones around your face. Your sinuses are located: Around your eyes. In the middle of your forehead. Behind your nose. In your cheekbones. Mucus normally drains out of your sinuses. When your nasal tissues become inflamed or swollen, mucus can become trapped or blocked. This allows bacteria, viruses, and fungi to grow, which leads to infection. Most infections of the sinuses are caused by a virus. A sinus infection can develop quickly. It can last for up to 4 weeks (acute) or for more than 12 weeks (chronic). A sinus infection often develops after a cold. What are the causes? This condition is caused by anything that creates swelling in the sinuses or stops mucus from draining. This includes: Allergies. Asthma. Infection from bacteria or viruses. Deformities or blockages in your nose or sinuses. Abnormal growths in the nose (nasal polyps). Pollutants, such as chemicals or irritants in the air. Infection from fungi. This is rare. What increases the risk? You are more likely to develop this condition if you: Have a weak body defense system (immune system). Do a lot of swimming or diving. Overuse nasal sprays. Smoke. What are the signs or symptoms? The main symptoms of this condition are pain and a feeling of pressure around the affected sinuses. Other symptoms include: Stuffy nose or congestion that makes it difficult to breathe through your nose. Thick yellow or greenish drainage from your nose. Tenderness, swelling, and warmth over the affected sinuses. A cough that  may get worse at night. Decreased sense of smell and taste. Extra mucus that collects in the throat or the back of the nose (postnasal drip) causing a sore throat or bad breath. Tiredness (fatigue). Fever. How is this diagnosed? This condition is diagnosed based on: Your symptoms. Your medical history. A physical exam. Tests to find out if your condition is acute or chronic. This may include: Checking your nose for  nasal polyps. Viewing your sinuses using a device that has a light (endoscope). Testing for allergies or bacteria. Imaging tests, such as an MRI or CT scan. In rare cases, a bone biopsy may be done to rule out more serious types of fungal sinus disease. How is this treated? Treatment for a sinus infection depends on the cause and whether your condition is chronic or acute. If caused by a virus, your symptoms should go away on their own within 10 days. You may be given medicines to relieve symptoms. They include: Medicines that shrink swollen nasal passages (decongestants). A spray that eases inflammation of the nostrils (topical intranasal corticosteroids). Rinses that help get rid of thick mucus in your nose (nasal saline washes). Medicines that treat allergies (antihistamines). Over-the-counter pain relievers. If caused by bacteria, your health care provider may recommend waiting to see if your symptoms improve. Most bacterial infections will get better without antibiotic medicine. You may be given antibiotics if you have: A severe infection. A weak immune system. If caused by narrow nasal passages or nasal polyps, surgery may be needed. Follow these instructions at home: Medicines Take, use, or apply over-the-counter and prescription medicines only as told by your health care provider. These may include nasal sprays. If you were prescribed an antibiotic medicine, take it as told by your health care provider. Do not stop taking the antibiotic even if you start to feel better. Hydrate and humidify  Drink enough fluid to keep your urine pale yellow. Staying hydrated will help to thin your mucus. Use a cool mist humidifier to keep the humidity level in your home above 50%. Inhale steam for 10-15 minutes, 3-4 times a day, or as told by your health care provider. You can do this in the bathroom while a hot shower is running. Limit your exposure to cool or dry air. Rest Rest as much as  possible. Sleep with your head raised (elevated). Make sure you get enough sleep each night. General instructions  Apply a warm, moist washcloth to your face 3-4 times a day or as told by your health care provider. This will help with discomfort. Use nasal saline washes as often as told by your health care provider. Wash your hands often with soap and water to reduce your exposure to germs. If soap and water are not available, use hand sanitizer. Do not smoke. Avoid being around people who are smoking (secondhand smoke). Keep all follow-up visits. This is important. Contact a health care provider if: You have a fever. Your symptoms get worse. Your symptoms do not improve within 10 days. Get help right away if: You have a severe headache. You have persistent vomiting. You have severe pain or swelling around your face or eyes. You have vision problems. You develop confusion. Your neck is stiff. You have trouble breathing. These symptoms may be an emergency. Get help right away. Call 911. Do not wait to see if the symptoms will go away. Do not drive yourself to the hospital. Summary A sinus infection is soreness and inflammation of your sinuses. Sinuses are  hollow spaces in the bones around your face. This condition is caused by nasal tissues that become inflamed or swollen. The swelling traps or blocks the flow of mucus. This allows bacteria, viruses, and fungi to grow, which leads to infection. If you were prescribed an antibiotic medicine, take it as told by your health care provider. Do not stop taking the antibiotic even if you start to feel better. Keep all follow-up visits. This is important. This information is not intended to replace advice given to you by your health care provider. Make sure you discuss any questions you have with your health care provider. Document Revised: 06/10/2021 Document Reviewed: 06/10/2021 Elsevier Patient Education  2023 Elsevier Inc.    If you  have been instructed to have an in-person evaluation today at a local Urgent Care facility, please use the link below. It will take you to a list of all of our available [facility], including address, phone number and hours of operation. Please do not delay care.  [facility]  If you or a family member do not have a primary care provider, use the link below to schedule a visit and establish care. When you choose a [facility] primary care physician or advanced practice provider, you gain a long-term partner in health. Find a Primary Care Provider  Learn more about [facility] in-office and virtual care options:  - Get Care Now

## 2022-11-13 NOTE — Addendum Note (Signed)
Addended by: Margaretann Loveless on: 11/13/2022 09:12 AM   Modules accepted: Orders

## 2022-11-13 NOTE — Progress Notes (Signed)
[name] - 32 y.o. female MRN [id number]  Date of birth: Nov 18, 1990  Office Visit Note: Visit Date: 11/13/2022 PCP: Sonya Christians, DO Referred by: Sonya Fendt, NP  Subjective: Chief Complaint  Patient presents with   Right Knee - Pain   HPI: Sonya Barton is a pleasant 32 y.o. female who presents today for chronic right knee pain.  Has had right knee pain that is intermittent for the last 1 year or so. She does state she used to previously do [employer] during a delivery she fell forward onto the right knee and hand right knee fall directly onto a metal step.  She was evaluated by another physician because she had swelling over this location and was told that it was a bloody fluid pocket.  She did have swelling that was localized right over her tender area and this improved after a few months.  The pain did subside, but she still has pain right over that location, pointing to the tibial tubercle.  Has pain with kneeling, after standing for longer periods of time.  Has not had treatment yet today.  She denied having knee pain as a younger child.  Pertinent ROS were reviewed with the patient and found to be negative unless otherwise specified above in HPI.   Assessment & Plan: Visit Diagnoses:  1. Chronic pain of right knee   2. Contusion of left knee, initial encounter    Plan: Discussed with Sonya Barton the nature of her knee pain, which is directly over the tibial tubercle where she has a small calcification/ossicle.  I do think it was possible that this happened when she had a fall directly onto the knee.  She did have a pocket of fluid, likely blood, that I am curious if this calcified here at this region at the distal patellar tendon insertion on the tibial tubercle.  She denies any prior history of knee pain as a child, this could be fragmentation from prior [employer] but it is slightly more superior than I would suggest.  Lower suspicion for small fragmented  fracture, she has an intact straight leg raise so this is certainly not acute. We discussed all treatment options today, will start her on meloxicam 15 mg to be taken once daily for the next 3 weeks, then may transition to as needed.  I would like her to rub topical Voltaren gel over the painful area.    Additional treatment considerations may be formalized physical therapy versus home therapy, trial of extracorporeal shockwave therapy; knee compression sleeve have a low suspicion for MRI to evaluate acuity of ossicle.  Follow-up: Return in about 6 weeks (around 12/25/2022) for for right knee pain.   Meds & Orders: No orders of the defined types were placed in this encounter.   Orders Placed This Encounter  Procedures   XR Knee Complete 4 Views Right   [place] Extrem Low Right Ltd     Procedures: No procedures performed      Clinical History: No specialty comments available.  She reports that she has never smoked. She does not have any smokeless tobacco history on file.  Recent Labs    03/30/22 1659 10/30/22 0000  HGBA1C 5.6 6.2*    Objective:    Physical Exam  Gen: Well-appearing, in no acute distress; non-toxic CV: Regular Rate. Well-perfused. Warm.  Resp: Breathing unlabored on room air; no wheezing. Psych: Fluid speech in conversation; appropriate affect; normal thought process Neuro: Sensation intact throughout. No gross  coordination deficits.   Ortho Exam - Right knee: There is no swelling or effusion of the knee.  There is full range of motion from 0-130 degrees.  No varus or valgus instability.  Strength 5/5 with knee flexion and extension.  There is positive TTP directly over the tibial tubercle with a small palpable nodule.   Imaging: [place] Extrem Low Right Ltd  Result Date: 11/13/2022 Limited musculoskeletal ultrasound of the right lower extremity, right knee was performed today.  Evaluation of the patella shows no cortical irregularity, there is patellar tendon evaluated  in short and long axis without abnormality.  Scanning distally to the tibial tubercle there is some fragmentation noted at this location.  In short axis there is some mild hypoechoic fluid surrounding this location.  Evaluation of the suprapatellar pouch shows no effusion.  The quadricep tendon does show proximal insertion on the superior patella without abnormality or tearing.  XR Knee Complete 4 Views Right  Result Date: 11/13/2022 4 views of the right knee including standing AP, Rosenberg, lateral and sunrise views were ordered and reviewed by myself.  X-rays demonstrate preserved tibiofemoral joint spaces.  The knee does fall into a slightly valgus fashion.  On lateral view there is a calcification there is a possible ossicle just superior to the tibial tubercle.     Past Medical/Family/Surgical/Social History: Medications & Allergies reviewed per EMR, new medications updated. Patient Active Problem List   Diagnosis Date Noted   Prediabetes 11/02/2022   Contraception management 03/31/2022   Iron deficiency anemia 03/30/2022   Obesity 03/30/2022   Health care maintenance 03/30/2022   Past Medical History:  Diagnosis Date   Obesity    Family History  Problem Relation Age of Onset   Hypertension Mother    Hypertension Father    Past Surgical History:  Procedure Laterality Date   WISDOM TOOTH EXTRACTION     Social History   Occupational History   Occupation: receptionist  Tobacco Use   Smoking status: Never   Smokeless tobacco: Not on file  Substance and Sexual Activity   Alcohol use: No   Drug use: No   Sexual activity: Not on file

## 2022-11-13 NOTE — Progress Notes (Signed)
Virtual Visit Consent   Sonya Barton, you are scheduled for a virtual visit with a [facility] provider today. Just as with appointments in the office, your consent must be obtained to participate. Your consent will be active for this visit and any virtual visit you may have with one of our providers in the next 365 days. If you have a MyChart account, a copy of this consent can be sent to you electronically.  As this is a virtual visit, video technology does not allow for your provider to perform a traditional examination. This may limit your provider's ability to fully assess your condition. If your provider identifies any concerns that need to be evaluated in person or the need to arrange testing (such as labs, EKG, etc.), we will make arrangements to do so. Although advances in technology are sophisticated, we cannot ensure that it will always work on either your end or our end. If the connection with a video visit is poor, the visit may have to be switched to a telephone visit. With either a video or telephone visit, we are not always able to ensure that we have a secure connection.  By engaging in this virtual visit, you consent to the provision of healthcare and authorize for your insurance to be billed (if applicable) for the services provided during this visit. Depending on your insurance coverage, you may receive a charge related to this service.  I need to obtain your verbal consent now. Are you willing to proceed with your visit today? Sonya Barton has provided verbal consent on 11/13/2022 for a virtual visit (video or telephone). Sonya Loveless, PA-C  Date: 11/13/2022 8:05 AM  Virtual Visit via Video Note   I, Sonya Barton, connected with  Sonya Barton  ([id number], 27-Dec-1990) on 11/13/22 at  8:00 AM EDT by a video-enabled telemedicine application and verified that I am speaking with the correct person using two identifiers.  Location: Patient: Virtual Visit  Location Patient: Mobile Provider: Virtual Visit Location Provider: Home Office   I discussed the limitations of evaluation and management by telemedicine and the availability of in person appointments. The patient expressed understanding and agreed to proceed.    History of Present Illness: Sonya Barton is a 32 y.o. who identifies as a female who was assigned female at birth, and is being seen today for possible sinus infection.  HPI: Sinusitis This is a recurrent problem. The current episode started in the past 7 days. The problem has been gradually worsening since onset. There has been no fever. The pain is moderate. Associated symptoms include congestion, coughing (mild), ear pain (popping and mild pain intermittent), headaches, sinus pressure and a sore throat (from post nasal drainage). Pertinent negatives include no chills or hoarse voice. (Post nasal drainage) Past treatments include acetaminophen (mucinex, allergy medication (zyrtec), delsym, decongestant). The treatment provided no relief.     Problems:  Patient Active Problem List   Diagnosis Date Noted   Prediabetes 11/02/2022   Contraception management 03/31/2022   Iron deficiency anemia 03/30/2022   Obesity 03/30/2022   Health care maintenance 03/30/2022    Allergies: No Known Allergies Medications:  Current Outpatient Medications:    doxycycline (VIBRA-TABS) 100 MG tablet, Take 1 tablet (100 mg total) by mouth 2 (two) times daily., Disp: 20 tablet, Rfl: 0   fluticasone (FLONASE) 50 MCG/ACT nasal spray, Place 2 sprays into both nostrils daily., Disp: 16 g, Rfl: 0  Observations/Objective: Patient is well-developed, well-nourished in no  acute distress.  Resting comfortably at home.  Head is normocephalic, atraumatic.  No labored breathing.  Speech is clear and coherent with logical content.  Patient is alert and oriented at baseline.    Assessment and Plan: 1. Acute bacterial sinusitis - doxycycline (VIBRA-TABS)  100 MG tablet; Take 1 tablet (100 mg total) by mouth 2 (two) times daily.  Dispense: 20 tablet; Refill: 0 - fluticasone (FLONASE) 50 MCG/ACT nasal spray; Place 2 sprays into both nostrils daily.  Dispense: 16 g; Refill: 0  - Worsening symptoms that have not responded to OTC medications.  - Will give Doxycycline and Flonase - Continue allergy medications.  - Steam and humidifier can help - Stay well hydrated and get plenty of rest.  - Seek in person evaluation if no symptom improvement or if symptoms worsen   Follow Up Instructions: I discussed the assessment and treatment plan with the patient. The patient was provided an opportunity to ask questions and all were answered. The patient agreed with the plan and demonstrated an understanding of the instructions.  A copy of instructions were sent to the patient via MyChart unless otherwise noted below.    The patient was advised to call back or seek an in-person evaluation if the symptoms worsen or if the condition fails to improve as anticipated.  Time:  I spent 8 minutes with the patient via telehealth technology discussing the above problems/concerns.    Sonya Loveless, PA-C

## 2022-11-24 ENCOUNTER — Other Ambulatory Visit: Payer: Self-pay | Admitting: Family

## 2022-11-24 DIAGNOSIS — J329 Chronic sinusitis, unspecified: Secondary | ICD-10-CM

## 2022-11-24 NOTE — Telephone Encounter (Signed)
Complete

## 2022-12-31 NOTE — Progress Notes (Addendum)
Patient ID: Sonya Barton, female    DOB: May 25, 1991  MRN: [id number]  CC: Annual Physical Exam  Subjective: Sonya Barton is a 32 y.o. female who presents for annual physical exam.   Her concerns today include:  Patient reports initial referral to ENT required $380 payment to schedule an appointment. Patient would like referral sent to ENT within her health insurance network. Would like prediabetes lab rechecked today. No further issues/concerns for discussion today.   Patient Active Problem List   Diagnosis Date Noted   Prediabetes 11/02/2022   Contraception management 03/31/2022   Iron deficiency anemia 03/30/2022   Obesity 03/30/2022   Health care maintenance 03/30/2022     Current Outpatient Medications on File Prior to Visit  Medication Sig Dispense Refill   meloxicam (MOBIC) 15 MG tablet Take 1 tablet (15 mg total) by mouth daily. (Patient not taking: Reported on 01/01/2023) 30 tablet 0   SIMPESSE 0.15-0.03 &0.01 MG tablet Take 1 tablet by mouth daily.     doxycycline (VIBRA-TABS) 100 MG tablet Take 1 tablet (100 mg total) by mouth 2 (two) times daily. (Patient not taking: Reported on 01/01/2023) 20 tablet 0   fluticasone (FLONASE) 50 MCG/ACT nasal spray Place 2 sprays into both nostrils daily. (Patient not taking: Reported on 01/01/2023) 16 g 0   No current facility-administered medications on file prior to visit.    No Known Allergies  Social History   Socioeconomic History   Marital status: Single    Spouse name: Not on file   Number of children: Not on file   Years of education: Not on file   Highest education level: Not on file  Occupational History   Occupation: receptionist  Tobacco Use   Smoking status: Never   Smokeless tobacco: Not on file  Substance and Sexual Activity   Alcohol use: No   Drug use: No   Sexual activity: Not on file  Other Topics Concern   Not on file  Social History Narrative   Not on file   Social Determinants of Health    Financial Resource Strain: Not on file  Food Insecurity: Not on file  Transportation Needs: Not on file  Physical Activity: Not on file  Stress: Not on file  Social Connections: Not on file  Intimate Partner Violence: Not on file    Family History  Problem Relation Age of Onset   Hypertension Mother    Hypertension Father     Past Surgical History:  Procedure Laterality Date   WISDOM TOOTH EXTRACTION      ROS: Review of Systems Negative except as stated above  PHYSICAL EXAM: BP 112/75 (BP Location: Right Wrist, Patient Position: Sitting, Cuff Size: Large)   Pulse 87   Resp 16   Ht 5\' 2"  (1.575 m)   Wt (!) 358 lb 12.8 oz (162.8 kg)   SpO2 98%   BMI 65.63 kg/m    Physical Exam HENT:     Head: Normocephalic and atraumatic.     Right Ear: Tympanic membrane, ear canal and external ear normal.     Left Ear: Tympanic membrane, ear canal and external ear normal.     Nose: Nose normal.     Mouth/Throat:     Mouth: Mucous membranes are moist.     Pharynx: Oropharynx is clear.  Eyes:     Extraocular Movements: Extraocular movements intact.     Conjunctiva/sclera: Conjunctivae normal.     Pupils: Pupils are equal, round, and reactive to light.  Cardiovascular:     Rate and Rhythm: Normal rate and regular rhythm.     Pulses: Normal pulses.     Heart sounds: Normal heart sounds.  Pulmonary:     Effort: Pulmonary effort is normal.     Breath sounds: Normal breath sounds.  Chest:     Comments: Patient declined.  Abdominal:     General: Bowel sounds are normal.     Palpations: Abdomen is soft.  Genitourinary:    Comments: Patient declined.  Musculoskeletal:        General: Normal range of motion.     Right shoulder: Normal.     Left shoulder: Normal.     Right upper arm: Normal.     Left upper arm: Normal.     Right elbow: Normal.     Left elbow: Normal.     Right forearm: Normal.     Left forearm: Normal.     Right wrist: Normal.     Left wrist: Normal.      Right hand: Normal.     Left hand: Normal.     Cervical back: Normal, normal range of motion and neck supple.     Thoracic back: Normal.     Lumbar back: Normal.     Right hip: Normal.     Left hip: Normal.     Right upper leg: Normal.     Left upper leg: Normal.     Right knee: Normal.     Left knee: Normal.     Right lower leg: Normal.     Left lower leg: Normal.     Right ankle: Normal.     Left ankle: Normal.     Right foot: Normal.     Left foot: Normal.  Skin:    General: Skin is warm and dry.     Capillary Refill: Capillary refill takes less than 2 seconds.  Neurological:     General: No focal deficit present.     Mental Status: She is alert and oriented to person, place, and time.  Psychiatric:        Mood and Affect: Mood normal.        Behavior: Behavior normal.    ASSESSMENT AND PLAN: 1. Annual physical exam - Counseled on 150 minutes of exercise per week as tolerated, healthy eating (including decreased daily intake of saturated fats, cholesterol, added sugars, sodium), STI prevention, and routine healthcare maintenance.  2. Screening for metabolic disorder - Routine screening.  - Hepatic Function Panel  3. Screening for deficiency anemia - Routine screening.  - CBC  4. Prediabetes - Routine screening.  - Hemoglobin A1c  5. Thyroid disorder screen - Routine screening.  - TSH  6. Encounter for screening for HIV - Routine screening.  - HIV antibody (with reflex)  7. Need for hepatitis C screening test - Routine screening.  - Hepatitis C Antibody  8. Chronic sinusitis, unspecified location - Referral to ENT for further evaluation/management.  - Ambulatory referral to ENT    Patient was given the opportunity to ask questions.  Patient verbalized understanding of the plan and was able to repeat key elements of the plan. Patient was given clear instructions to go to Emergency Department or return to medical center if symptoms don't improve,  worsen, or new problems develop.The patient verbalized understanding.   Orders Placed This Encounter  Procedures   HIV antibody (with reflex)   Hepatitis C Antibody   Hepatic Function Panel   CBC  TSH   Hemoglobin A1c   Ambulatory referral to ENT    Return in about 1 year (around 01/01/2024) for Physical per patient preference.  Rema Fendt, NP

## 2023-01-01 ENCOUNTER — Ambulatory Visit (INDEPENDENT_AMBULATORY_CARE_PROVIDER_SITE_OTHER): Payer: 59 | Admitting: Family

## 2023-01-01 ENCOUNTER — Encounter: Payer: Self-pay | Admitting: Family

## 2023-01-01 ENCOUNTER — Ambulatory Visit: Payer: 59 | Admitting: Sports Medicine

## 2023-01-01 VITALS — BP 112/75 | HR 87 | Resp 16 | Ht 62.0 in | Wt 358.8 lb

## 2023-01-01 DIAGNOSIS — Z13228 Encounter for screening for other metabolic disorders: Secondary | ICD-10-CM | POA: Diagnosis not present

## 2023-01-01 DIAGNOSIS — Z130 Encounter for screening for diseases of the blood and blood-forming organs and certain disorders involving the immune mechanism: Secondary | ICD-10-CM | POA: Diagnosis not present

## 2023-01-01 DIAGNOSIS — R7303 Prediabetes: Secondary | ICD-10-CM | POA: Diagnosis not present

## 2023-01-01 DIAGNOSIS — Z1329 Encounter for screening for other suspected endocrine disorder: Secondary | ICD-10-CM

## 2023-01-01 DIAGNOSIS — J329 Chronic sinusitis, unspecified: Secondary | ICD-10-CM

## 2023-01-01 DIAGNOSIS — Z0000 Encounter for general adult medical examination without abnormal findings: Secondary | ICD-10-CM | POA: Diagnosis not present

## 2023-01-01 DIAGNOSIS — Z1159 Encounter for screening for other viral diseases: Secondary | ICD-10-CM

## 2023-01-01 DIAGNOSIS — Z114 Encounter for screening for human immunodeficiency virus [HIV]: Secondary | ICD-10-CM

## 2023-01-01 DIAGNOSIS — Z7689 Persons encountering health services in other specified circumstances: Secondary | ICD-10-CM | POA: Diagnosis not present

## 2023-01-01 NOTE — Patient Instructions (Signed)

## 2023-01-02 ENCOUNTER — Other Ambulatory Visit: Payer: Self-pay | Admitting: Family

## 2023-01-02 DIAGNOSIS — D75839 Thrombocytosis, unspecified: Secondary | ICD-10-CM

## 2023-01-02 LAB — HEPATIC FUNCTION PANEL
ALT: 12 IU/L (ref 0–32)
AST: 8 IU/L (ref 0–40)
Albumin: 3.9 g/dL (ref 3.9–4.9)
Alkaline Phosphatase: 88 IU/L (ref 44–121)
Bilirubin Total: 0.2 mg/dL (ref 0.0–1.2)
Bilirubin, Direct: <0.1 mg/dL (ref 0.00–0.40)
Total Protein: 6.5 g/dL (ref 6.0–8.5)

## 2023-01-02 LAB — HIV ANTIBODY (ROUTINE TESTING W REFLEX): HIV Screen 4th Generation wRfx: NONREACTIVE

## 2023-01-02 LAB — CBC
Hematocrit: 34.4 % (ref 34.0–46.6)
Hemoglobin: 10.8 g/dL — ABNORMAL LOW (ref 11.1–15.9)
MCH: 23.8 pg — ABNORMAL LOW (ref 26.6–33.0)
MCHC: 31.4 g/dL — ABNORMAL LOW (ref 31.5–35.7)
MCV: 76 fL — ABNORMAL LOW (ref 79–97)
Platelets: 520 10*3/uL — ABNORMAL HIGH (ref 150–450)
RBC: 4.53 x10E6/uL (ref 3.77–5.28)
RDW: 15.2 % (ref 11.7–15.4)
WBC: 5.8 10*3/uL (ref 3.4–10.8)

## 2023-01-02 LAB — HEPATITIS C ANTIBODY: Hep C Virus Ab: NONREACTIVE

## 2023-01-02 LAB — TSH: TSH: 0.753 u[IU]/mL (ref 0.450–4.500)

## 2023-01-04 ENCOUNTER — Telehealth: Payer: Self-pay | Admitting: Internal Medicine

## 2023-01-04 NOTE — Telephone Encounter (Signed)
scheduled per referral, pt has been called and confirmed date and time. Pt is aware of location and to arrive early for check in   

## 2023-01-05 LAB — HEMOGLOBIN A1C
Est. average glucose Bld gHb Est-mCnc: 126 mg/dL
Hgb A1c MFr Bld: 6 % — ABNORMAL HIGH (ref 4.8–5.6)

## 2023-01-05 LAB — SPECIMEN STATUS REPORT

## 2023-01-06 LAB — HEMOGLOBIN A1C

## 2023-01-06 LAB — SPECIMEN STATUS REPORT

## 2023-01-08 ENCOUNTER — Other Ambulatory Visit: Payer: Self-pay | Admitting: Internal Medicine

## 2023-01-08 DIAGNOSIS — D508 Other iron deficiency anemias: Secondary | ICD-10-CM

## 2023-01-09 ENCOUNTER — Inpatient Hospital Stay: Payer: 59

## 2023-01-09 ENCOUNTER — Encounter: Payer: Self-pay | Admitting: Internal Medicine

## 2023-01-09 ENCOUNTER — Inpatient Hospital Stay: Payer: 59 | Attending: Internal Medicine | Admitting: Internal Medicine

## 2023-01-09 VITALS — BP 136/94 | HR 94 | Temp 98.6°F | Resp 16 | Ht 62.0 in | Wt 358.0 lb

## 2023-01-09 DIAGNOSIS — D75838 Other thrombocytosis: Secondary | ICD-10-CM | POA: Diagnosis not present

## 2023-01-09 DIAGNOSIS — N920 Excessive and frequent menstruation with regular cycle: Secondary | ICD-10-CM | POA: Diagnosis not present

## 2023-01-09 DIAGNOSIS — D500 Iron deficiency anemia secondary to blood loss (chronic): Secondary | ICD-10-CM | POA: Diagnosis not present

## 2023-01-09 DIAGNOSIS — R7303 Prediabetes: Secondary | ICD-10-CM | POA: Diagnosis not present

## 2023-01-09 DIAGNOSIS — D508 Other iron deficiency anemias: Secondary | ICD-10-CM

## 2023-01-09 LAB — CBC WITH DIFFERENTIAL (CANCER CENTER ONLY)
Abs Immature Granulocytes: 0.01 10*3/uL (ref 0.00–0.07)
Basophils Absolute: 0 10*3/uL (ref 0.0–0.1)
Basophils Relative: 0 %
Eosinophils Absolute: 0.1 10*3/uL (ref 0.0–0.5)
Eosinophils Relative: 2 %
HCT: 35.2 % — ABNORMAL LOW (ref 36.0–46.0)
Hemoglobin: 11.1 g/dL — ABNORMAL LOW (ref 12.0–15.0)
Immature Granulocytes: 0 %
Lymphocytes Relative: 29 %
Lymphs Abs: 2.2 10*3/uL (ref 0.7–4.0)
MCH: 24 pg — ABNORMAL LOW (ref 26.0–34.0)
MCHC: 31.5 g/dL (ref 30.0–36.0)
MCV: 76.2 fL — ABNORMAL LOW (ref 80.0–100.0)
Monocytes Absolute: 0.4 10*3/uL (ref 0.1–1.0)
Monocytes Relative: 6 %
Neutro Abs: 4.8 10*3/uL (ref 1.7–7.7)
Neutrophils Relative %: 63 %
Platelet Count: 465 10*3/uL — ABNORMAL HIGH (ref 150–400)
RBC: 4.62 MIL/uL (ref 3.87–5.11)
RDW: 15.7 % — ABNORMAL HIGH (ref 11.5–15.5)
WBC Count: 7.5 10*3/uL (ref 4.0–10.5)
nRBC: 0 % (ref 0.0–0.2)

## 2023-01-09 LAB — CMP (CANCER CENTER ONLY)
ALT: 10 U/L (ref 0–44)
AST: 11 U/L — ABNORMAL LOW (ref 15–41)
Albumin: 3.7 g/dL (ref 3.5–5.0)
Alkaline Phosphatase: 73 U/L (ref 38–126)
Anion gap: 7 (ref 5–15)
BUN: 10 mg/dL (ref 6–20)
CO2: 23 mmol/L (ref 22–32)
Calcium: 9.1 mg/dL (ref 8.9–10.3)
Chloride: 109 mmol/L (ref 98–111)
Creatinine: 0.65 mg/dL (ref 0.44–1.00)
GFR, Estimated: >60 mL/min (ref >60)
Glucose, Bld: 99 mg/dL (ref 70–99)
Potassium: 3.9 mmol/L (ref 3.5–5.1)
Sodium: 139 mmol/L (ref 135–145)
Total Bilirubin: 0.4 mg/dL (ref 0.3–1.2)
Total Protein: 6.9 g/dL (ref 6.5–8.1)

## 2023-01-09 LAB — FERRITIN: Ferritin: 6 ng/mL — ABNORMAL LOW (ref 11–307)

## 2023-01-09 LAB — VITAMIN B12: Vitamin B-12: 155 pg/mL — ABNORMAL LOW (ref 180–914)

## 2023-01-09 LAB — IRON AND IRON BINDING CAPACITY (CC-WL,HP ONLY)
Iron: 43 ug/dL (ref 28–170)
Saturation Ratios: 8 % — ABNORMAL LOW (ref 10.4–31.8)
TIBC: 525 ug/dL — ABNORMAL HIGH (ref 250–450)
UIBC: 482 ug/dL — ABNORMAL HIGH (ref 148–442)

## 2023-01-09 LAB — FOLATE: Folate: 4.7 ng/mL — ABNORMAL LOW (ref >5.9)

## 2023-01-09 MED ORDER — VITAMIN B-12 1000 MCG PO TABS: 1000.0000 ug | ORAL_TABLET | Freq: Every day | ORAL | 3 refills | Status: DC

## 2023-01-09 MED ORDER — FOLIC ACID 1 MG PO TABS: 1.0000 mg | ORAL_TABLET | Freq: Every day | ORAL | 4 refills | Status: DC

## 2023-01-09 NOTE — Progress Notes (Signed)
[facility] [phone]   [phone]  CONSULT NOTE  REFERRING PHYSICIAN: Ricky Stabs, NP  REASON FOR CONSULTATION:  32 years old African-American female with thrombocytosis.  HPI Sonya Barton is a 32 y.o. female with past medical history significant for obesity, prediabetes as well as dyslipidemia and long history of anemia since she was age 67.  The patient has tried to take iron in the past but was not effective and she had upset stomach.  She takes it on and off but not recently.  She used to have heavy menstrual period with clots but started oral contraceptive a year ago and her.  Or less and limited to 4 days with no clots or significant bleeding.  She has been feeling tired and fatigued most of the time.  She feels cold even in hot weather.  She has occasional shortness of breath but no dizzy spells.  She also has occasional blurry vision.  Patient also feels heart palpitation.  She was seen recently by her primary care provider on 01/01/2023 and that showed hemoglobin of 10.8 and hematocrit 34.4 with MCV of 76.  She has elevated platelets count of 520 at that time.  She was referred to me for evaluation regarding her anemia and thrombocytosis.  The patient continues to have the above complaint but denied having any significant nausea, vomiting, diarrhea or constipation.  She has no chest pain, cough or hemoptysis.  She has no fever or chills.  She denied having any significant weight loss or night sweats. Family history significant for father with unknown medical history but her mother had hypertension and dyslipidemia.  She also has 3 sisters with anemia. The patient is single and has no children.  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  She has no history of smoking, alcohol or drug abuse. HPI  Past Medical History:  Diagnosis Date   Obesity     Past Surgical History:  Procedure Laterality Date   WISDOM TOOTH  EXTRACTION      Family History  Problem Relation Age of Onset   Hypertension Mother    Hypertension Father     Social History Social History   Tobacco Use   Smoking status: Never  Substance Use Topics   Alcohol use: No   Drug use: No    No Known Allergies  Current Outpatient Medications  Medication Sig Dispense Refill   meloxicam (MOBIC) 15 MG tablet Take 1 tablet (15 mg total) by mouth daily. (Patient not taking: Reported on 01/01/2023) 30 tablet 0   doxycycline (VIBRA-TABS) 100 MG tablet Take 1 tablet (100 mg total) by mouth 2 (two) times daily. (Patient not taking: Reported on 01/01/2023) 20 tablet 0   fluticasone (FLONASE) 50 MCG/ACT nasal spray Place 2 sprays into both nostrils daily. (Patient not taking: Reported on 01/01/2023) 16 g 0   SIMPESSE 0.15-0.03 &0.01 MG tablet Take 1 tablet by mouth daily.     No current facility-administered medications for this visit.    Review of Systems  Constitutional: positive for fatigue Eyes: negative Ears, nose, mouth, throat, and face: negative Respiratory: positive for dyspnea on exertion Cardiovascular: negative Gastrointestinal: negative Genitourinary:negative Integument/breast: negative Hematologic/lymphatic: negative Musculoskeletal:negative Neurological: negative Behavioral/Psych: negative Endocrine: negative Allergic/Immunologic: negative  Physical Exam  AVW:UJWJX, healthy, no distress, well nourished, well developed, obese, and pale SKIN: skin color, texture, turgor are normal HEAD: Normocephalic EYES: normal, PERRLA, Conjunctiva are pink and non-injected EARS: External ears  normal, Canals clear OROPHARYNX:no exudate, no erythema, and lips, buccal mucosa, and tongue normal  NECK: supple, no adenopathy, no JVD LYMPH:  no palpable lymphadenopathy, no hepatosplenomegaly BREAST:not examined LUNGS: clear to auscultation , and palpation HEART: regular rate & rhythm, no murmurs, and no gallops ABDOMEN:abdomen soft,  non-tender, and obese BACK: Back symmetric, no curvature., No CVA tenderness EXTREMITIES:no joint deformities, effusion, or inflammation, no edema  NEURO: alert & oriented x 3 with fluent speech, no focal motor/sensory deficits  PERFORMANCE STATUS: ECOG 1  LABORATORY DATA: Lab Results  Component Value Date   WBC 5.8 01/01/2023   HGB 10.8 (L) 01/01/2023   HCT 34.4 01/01/2023   MCV 76 (L) 01/01/2023   PLT 520 (H) 01/01/2023      Chemistry      Component Value Date/Time   NA 139 10/30/2022 0000   K 4.3 10/30/2022 0000   CL 103 10/30/2022 0000   CO2 20 10/30/2022 0000   BUN 11 10/30/2022 0000   CREATININE 0.57 10/30/2022 0000      Component Value Date/Time   CALCIUM 9.3 10/30/2022 0000   ALKPHOS 88 01/01/2023 0000   AST 8 01/01/2023 0000   ALT 12 01/01/2023 0000   BILITOT 0.2 01/01/2023 0000       RADIOGRAPHIC STUDIES: No results found.  ASSESSMENT: This is a very pleasant 32 years old African-American female with persistent anemia secondary to menstrual blood loss with insufficient dietary supplements.  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    PLAN: I had a lengthy discussion with the patient today about her current condition and treatment options. I ordered several studies today for evaluation of her anemia including repeat CBC showed persistent anemia with hemoglobin of 11.1 and hematocrit 35.2 with MCV of 76.2. Iron study showed low normal serum iron of 43 with elevated TIBC and iron saturation of 8%.  Ferritin level was low at 6.  Also has low serum folate of 4.6 and low vitamin B12 of 155 Serum protein electrophoresis is still pending. I recommended for the patient to proceed with iron infusion with Venofer 300 Mg IV weekly for 3 weeks. For the low serum folate and vitamin B12, I will call her pharmacy with prescription for folic acid and vitamin B12 supplements. I will see the patient back for follow-up visit  in 3 months for evaluation and repeat blood work. For the moderate obesity she was referred by her primary care provider for a weight loss clinic.  She may benefit from some of the newer medication for weight loss especially with her history of prediabetes and other health issues. She was advised to call immediately if she has any other concerning symptoms in the interval.  The patient voices understanding of current disease status and treatment options and is in agreement with the current care plan.  All questions were answered. The patient knows to call the clinic with any problems, questions or concerns. We can certainly see the patient much sooner if necessary.  Thank you so much for allowing me to participate in the care of Sonya Barton. I will continue to follow up the patient with you and assist in her care.   The total time spent in the appointment was 60 minutes.  Disclaimer: This note was dictated with voice recognition software. Similar sounding words can inadvertently be transcribed and may not be corrected upon review.   Lajuana Matte January 09, 2023, 10:30 AM

## 2023-01-11 ENCOUNTER — Telehealth: Payer: Self-pay

## 2023-01-11 NOTE — Telephone Encounter (Signed)
Auth Submission: NO AUTH NEEDED Site of care: Site of care: [facility] Payer: [employer] Employee - [employer] Medication & CPT/J Code(s) submitted: Venofer (Iron Sucrose) J1756  Auth type: Buy/Bill Units/visits requested: 300mg  weekly x 3 doses  Approval from: 01/11/2023 to 05/13/2023

## 2023-01-13 LAB — PROTEIN ELECTROPHORESIS, SERUM, WITH REFLEX
A/G Ratio: 0.9 (ref 0.7–1.7)
Albumin ELP: 3.3 g/dL (ref 2.9–4.4)
Alpha-1-Globulin: 0.3 g/dL (ref 0.0–0.4)
Alpha-2-Globulin: 1 g/dL (ref 0.4–1.0)
Beta Globulin: 1.2 g/dL (ref 0.7–1.3)
Gamma Globulin: 0.9 g/dL (ref 0.4–1.8)
Globulin, Total: 3.5 g/dL (ref 2.2–3.9)
Total Protein ELP: 6.8 g/dL (ref 6.0–8.5)

## 2023-01-28 ENCOUNTER — Encounter (INDEPENDENT_AMBULATORY_CARE_PROVIDER_SITE_OTHER): Payer: Self-pay | Admitting: Internal Medicine

## 2023-01-29 ENCOUNTER — Ambulatory Visit: Payer: 59

## 2023-01-29 VITALS — BP 171/96 | HR 85 | Temp 98.3°F | Resp 18 | Ht 62.0 in | Wt 355.6 lb

## 2023-01-29 DIAGNOSIS — N920 Excessive and frequent menstruation with regular cycle: Secondary | ICD-10-CM | POA: Diagnosis not present

## 2023-01-29 DIAGNOSIS — D500 Iron deficiency anemia secondary to blood loss (chronic): Secondary | ICD-10-CM

## 2023-01-29 MED ORDER — DIPHENHYDRAMINE HCL 25 MG PO CAPS
25.0000 mg | ORAL_CAPSULE | Freq: Once | ORAL | Status: AC
  Administered 2023-01-29: 25 mg via ORAL
  Filled 2023-01-29: qty 1

## 2023-01-29 MED ORDER — SODIUM CHLORIDE 0.9 % IV SOLN
300.0000 mg | INTRAVENOUS | Status: DC
  Administered 2023-01-29: 300 mg via INTRAVENOUS
  Filled 2023-01-29: qty 15

## 2023-01-29 MED ORDER — ACETAMINOPHEN 325 MG PO TABS
650.0000 mg | ORAL_TABLET | Freq: Once | ORAL | Status: AC
  Administered 2023-01-29: 650 mg via ORAL
  Filled 2023-01-29: qty 2

## 2023-01-29 NOTE — Progress Notes (Signed)
Diagnosis: Iron Deficiency Anemia  Provider:  Chilton Greathouse MD  Procedure: IV Infusion  IV Type: Peripheral, IV Location: L Forearm  Venofer (Iron Sucrose), Dose: 300 mg  Infusion Start Time: 1400  Infusion Stop Time: 1537  Post Infusion IV Care: Observation period completed and Peripheral IV Discontinued  Discharge: Condition: Good, Destination: Home . AVS Declined  Performed by:  Adriana Mccallum, RN

## 2023-02-05 ENCOUNTER — Ambulatory Visit (INDEPENDENT_AMBULATORY_CARE_PROVIDER_SITE_OTHER): Payer: 59

## 2023-02-05 VITALS — BP 153/94 | HR 89 | Temp 98.9°F | Resp 20 | Ht 62.0 in | Wt 355.2 lb

## 2023-02-05 DIAGNOSIS — N920 Excessive and frequent menstruation with regular cycle: Secondary | ICD-10-CM | POA: Diagnosis not present

## 2023-02-05 DIAGNOSIS — D500 Iron deficiency anemia secondary to blood loss (chronic): Secondary | ICD-10-CM | POA: Diagnosis not present

## 2023-02-05 MED ORDER — SODIUM CHLORIDE 0.9 % IV SOLN
300.0000 mg | INTRAVENOUS | Status: DC
  Administered 2023-02-05: 300 mg via INTRAVENOUS
  Filled 2023-02-05: qty 15

## 2023-02-05 MED ORDER — ACETAMINOPHEN 325 MG PO TABS: 650.0000 mg | ORAL_TABLET | Freq: Once | ORAL | Status: DC

## 2023-02-05 MED ORDER — DIPHENHYDRAMINE HCL 25 MG PO CAPS: 25.0000 mg | ORAL_CAPSULE | Freq: Once | ORAL | Status: DC

## 2023-02-05 NOTE — Progress Notes (Signed)
Diagnosis: Iron Deficiency Anemia  Provider:  Chilton Greathouse MD  Procedure: IV Infusion  IV Type: Peripheral, IV Location: L Antecubital  Venofer (Iron Sucrose), Dose: 300 mg  Infusion Start Time: 1300  Infusion Stop Time: 1437  Post Infusion IV Care: Patient declined observation (stated she wanted to stay for 15 min), and PIV discontinued  Discharge: Condition: Good, Destination: Home . AVS Declined  Performed by:  Wyvonne Lenz, RN

## 2023-02-12 ENCOUNTER — Ambulatory Visit: Payer: 59 | Admitting: *Deleted

## 2023-02-12 VITALS — BP 145/89 | HR 81 | Temp 99.0°F | Resp 16 | Ht 62.0 in | Wt 354.2 lb

## 2023-02-12 DIAGNOSIS — D500 Iron deficiency anemia secondary to blood loss (chronic): Secondary | ICD-10-CM

## 2023-02-12 DIAGNOSIS — N920 Excessive and frequent menstruation with regular cycle: Secondary | ICD-10-CM

## 2023-02-12 MED ORDER — ACETAMINOPHEN 325 MG PO TABS: 650.0000 mg | ORAL_TABLET | Freq: Once | ORAL | Status: DC

## 2023-02-12 MED ORDER — DIPHENHYDRAMINE HCL 25 MG PO CAPS: 25.0000 mg | ORAL_CAPSULE | Freq: Once | ORAL | Status: DC

## 2023-02-12 MED ORDER — SODIUM CHLORIDE 0.9 % IV SOLN
300.0000 mg | INTRAVENOUS | Status: DC
  Administered 2023-02-12: 300 mg via INTRAVENOUS
  Filled 2023-02-12: qty 15

## 2023-02-12 NOTE — Progress Notes (Signed)
Diagnosis: Iron Deficiency Anemia  Provider:  Chilton Greathouse MD  Procedure: IV Infusion  IV Type: Peripheral, IV Location: L Forearm  Venofer (Iron Sucrose), Dose: 300 mg  Infusion Start Time: 1304 pm  Infusion Stop Time: 1500 pm  Post Infusion IV Care: Observation period completed and Peripheral IV Discontinued  Discharge: Condition: Good, Destination: Home . AVS Declined  Performed by:  Forrest Moron, RN

## 2023-02-16 ENCOUNTER — Telehealth: Payer: 59 | Admitting: Family Medicine

## 2023-02-16 DIAGNOSIS — J069 Acute upper respiratory infection, unspecified: Secondary | ICD-10-CM

## 2023-02-16 NOTE — Progress Notes (Signed)
[place]   Patient is in [place], will get seen local

## 2023-02-22 ENCOUNTER — Telehealth: Payer: Self-pay | Admitting: Family

## 2023-02-22 ENCOUNTER — Ambulatory Visit: Payer: Self-pay | Admitting: Family

## 2023-02-22 NOTE — Telephone Encounter (Signed)
Called pt to reschedule appt scheduled for today; left a vm to call office back to schedule. Canceled pt's appt for today per pt's request, who is out of town.

## 2023-03-10 ENCOUNTER — Ambulatory Visit (INDEPENDENT_AMBULATORY_CARE_PROVIDER_SITE_OTHER): Payer: 59 | Admitting: Adult Health

## 2023-03-10 ENCOUNTER — Encounter (INDEPENDENT_AMBULATORY_CARE_PROVIDER_SITE_OTHER): Payer: Self-pay | Admitting: Adult Health

## 2023-03-10 VITALS — BP 136/83 | HR 98 | Temp 98.6°F | Ht 62.0 in | Wt 345.0 lb

## 2023-03-10 DIAGNOSIS — R7303 Prediabetes: Secondary | ICD-10-CM | POA: Diagnosis not present

## 2023-03-10 DIAGNOSIS — Z6841 Body Mass Index (BMI) 40.0 and over, adult: Secondary | ICD-10-CM

## 2023-03-10 NOTE — Progress Notes (Signed)
Office: [phone]  /  Fax: [phone]   Initial Visit  Sonya Barton was seen in clinic today to evaluate for obesity. She is interested in losing weight to improve overall health and reduce the risk of weight related complications. She presents today to review program treatment options, initial physical assessment, and evaluation.     She was referred by: PCP  When asked what else they would like to accomplish? She states: Adopt healthier eating patterns, Improve energy levels and physical activity, Improve quality of life, and Improve appearance  Weight history: Sonya Barton reports her weight has been hovering in 230 lbs for years, then steady increase since 2014/2015 when she started oral birth control.  Current weight 345 lbs  When asked how has your weight affected you? She states: Contributed to medical problems, Contributed to orthopedic problems or mobility issues, Having fatigue, Having poor endurance, and Problems with eating patterns  Some associated conditions: Prediabetes  Contributing factors: Family history, Medications, Reduced physical activity, and Eating patterns  Weight promoting medications identified: Contraceptives or hormonal therapy  Current nutrition plan: None  Current level of physical activity: None  Current or previous pharmacotherapy: None  Response to medication: Never tried medications   Past medical history includes:   Past Medical History:  Diagnosis Date   Obesity      Objective:   BP 136/83   Pulse 98   Temp 98.6 F (37 C)   Ht 5\' 2"  (1.575 m)   Wt (!) 345 lb (156.5 kg)   SpO2 100%   BMI 63.10 kg/m  She was weighed on the bioimpedance scale: Body mass index is 63.1 kg/m.  Peak Weight:353 , Body Fat%:60.4, Visceral Fat Rating:25, Weight trend over the last 12 months: Unchanged  General:  Alert, oriented and cooperative. Patient is in no acute distress.  Respiratory: Normal respiratory effort, no problems with respiration  noted   Gait: able to ambulate independently  Mental Status: Normal mood and affect. Normal behavior. Normal judgment and thought content.   DIAGNOSTIC DATA REVIEWED:  BMET    Component Value Date/Time   NA 139 01/09/2023 1056   NA 139 10/30/2022 0000   K 3.9 01/09/2023 1056   CL 109 01/09/2023 1056   CO2 23 01/09/2023 1056   GLUCOSE 99 01/09/2023 1056   BUN 10 01/09/2023 1056   BUN 11 10/30/2022 0000   CREATININE 0.65 01/09/2023 1056   CALCIUM 9.1 01/09/2023 1056   GFRNONAA >60 01/09/2023 1056   Lab Results  Component Value Date   HGBA1C CANCELED 01/01/2023   HGBA1C 6.0 (H) 01/01/2023   HGBA1C 5.6 03/30/2022   No results found for: "INSULIN" CBC    Component Value Date/Time   WBC 7.5 01/09/2023 1056   RBC 4.62 01/09/2023 1056   HGB 11.1 (L) 01/09/2023 1056   HGB 10.8 (L) 01/01/2023 0000   HCT 35.2 (L) 01/09/2023 1056   HCT 34.4 01/01/2023 0000   PLT 465 (H) 01/09/2023 1056   PLT 520 (H) 01/01/2023 0000   MCV 76.2 (L) 01/09/2023 1056   MCV 76 (L) 01/01/2023 0000   MCH 24.0 (L) 01/09/2023 1056   MCHC 31.5 01/09/2023 1056   RDW 15.7 (H) 01/09/2023 1056   RDW 15.2 01/01/2023 0000   Iron/TIBC/Ferritin/ %Sat    Component Value Date/Time   IRON 43 01/09/2023 1056   IRON 55 03/30/2022 1631   TIBC 525 (H) 01/09/2023 1056   TIBC 432 03/30/2022 1631   FERRITIN 6 (L) 01/09/2023 1056  FERRITIN 36 03/30/2022 1631   IRONPCTSAT 8 (L) 01/09/2023 1056   IRONPCTSAT 13 (L) 03/30/2022 1631   Lipid Panel     Component Value Date/Time   CHOL 185 10/30/2022 0000   TRIG 58 10/30/2022 0000   HDL 47 10/30/2022 0000   CHOLHDL 3.9 10/30/2022 0000   LDLCALC 127 (H) 10/30/2022 0000   Hepatic Function Panel     Component Value Date/Time   PROT 6.9 01/09/2023 1056   PROT 6.5 01/01/2023 0000   ALBUMIN 3.7 01/09/2023 1056   ALBUMIN 3.9 01/01/2023 0000   AST 11 (L) 01/09/2023 1056   ALT 10 01/09/2023 1056   ALKPHOS 73 01/09/2023 1056   BILITOT 0.4 01/09/2023 1056   BILIDIR  <0.10 01/01/2023 0000      Component Value Date/Time   TSH 0.753 01/01/2023 0000     Assessment and Plan:   Prediabetes  Morbid obesity (HCC), Starting BMI 63.2  ESTABLISH WITH HWW   Obesity Treatment / Action Plan:  Patient will work on garnering support from family and friends to begin weight loss journey. Will work on eliminating or reducing the presence of highly palatable, calorie dense foods in the home. Will complete provided nutritional and psychosocial assessment questionnaire before the next appointment. Will be scheduled for indirect calorimetry to determine resting energy expenditure in a fasting state.  This will allow [place] to create a reduced calorie, high-protein meal plan to promote loss of fat mass while preserving muscle mass. Counseled on the health benefits of losing 5%-15% of total body weight. Was counseled on nutritional approaches to weight loss and benefits of reducing processed foods and consuming plant-based foods and high quality protein as part of nutritional weight management. Was counseled on pharmacotherapy and role as an adjunct in weight management.   Obesity Education Performed Today:  She was weighed on the bioimpedance scale and results were discussed and documented in the synopsis.  We discussed obesity as a disease and the importance of a more detailed evaluation of all the factors contributing to the disease.  We discussed the importance of long term lifestyle changes which include nutrition, exercise and behavioral modifications as well as the importance of customizing this to her specific health and social needs.  We discussed the benefits of reaching a healthier weight to alleviate the symptoms of existing conditions and reduce the risks of the biomechanical, metabolic and psychological effects of obesity.  Sonya Barton appears to be in the action stage of change and states they are ready to start intensive lifestyle modifications and  behavioral modifications.  30 minutes was spent today on this visit including the above counseling, pre-visit chart review, and post-visit documentation.  Reviewed by clinician on day of visit: allergies, medications, problem list, medical history, surgical history, family history, social history, and previous encounter notes pertinent to obesity diagnosis.   Yahsir Wickens d. Mashal Slavick, NP-C

## 2023-04-12 ENCOUNTER — Inpatient Hospital Stay: Payer: 59 | Attending: Internal Medicine

## 2023-04-12 ENCOUNTER — Inpatient Hospital Stay (HOSPITAL_BASED_OUTPATIENT_CLINIC_OR_DEPARTMENT_OTHER): Payer: 59 | Admitting: Internal Medicine

## 2023-04-12 VITALS — BP 173/106 | HR 92 | Temp 98.2°F | Resp 17 | Ht 62.0 in | Wt 351.1 lb

## 2023-04-12 DIAGNOSIS — J329 Chronic sinusitis, unspecified: Secondary | ICD-10-CM | POA: Insufficient documentation

## 2023-04-12 DIAGNOSIS — D509 Iron deficiency anemia, unspecified: Secondary | ICD-10-CM | POA: Insufficient documentation

## 2023-04-12 DIAGNOSIS — D500 Iron deficiency anemia secondary to blood loss (chronic): Secondary | ICD-10-CM

## 2023-04-12 DIAGNOSIS — K219 Gastro-esophageal reflux disease without esophagitis: Secondary | ICD-10-CM | POA: Insufficient documentation

## 2023-04-12 DIAGNOSIS — D508 Other iron deficiency anemias: Secondary | ICD-10-CM

## 2023-04-12 DIAGNOSIS — J302 Other seasonal allergic rhinitis: Secondary | ICD-10-CM | POA: Insufficient documentation

## 2023-04-12 LAB — CBC WITH DIFFERENTIAL (CANCER CENTER ONLY)
Abs Immature Granulocytes: 0.02 10*3/uL (ref 0.00–0.07)
Basophils Absolute: 0.1 10*3/uL (ref 0.0–0.1)
Basophils Relative: 1 %
Eosinophils Absolute: 0.3 10*3/uL (ref 0.0–0.5)
Eosinophils Relative: 3 %
HCT: 37.5 % (ref 36.0–46.0)
Hemoglobin: 12.1 g/dL (ref 12.0–15.0)
Immature Granulocytes: 0 %
Lymphocytes Relative: 45 %
Lymphs Abs: 3.4 10*3/uL (ref 0.7–4.0)
MCH: 25.7 pg — ABNORMAL LOW (ref 26.0–34.0)
MCHC: 32.3 g/dL (ref 30.0–36.0)
MCV: 79.6 fL — ABNORMAL LOW (ref 80.0–100.0)
Monocytes Absolute: 0.4 10*3/uL (ref 0.1–1.0)
Monocytes Relative: 6 %
Neutro Abs: 3.4 10*3/uL (ref 1.7–7.7)
Neutrophils Relative %: 45 %
Platelet Count: 446 10*3/uL — ABNORMAL HIGH (ref 150–400)
RBC: 4.71 MIL/uL (ref 3.87–5.11)
RDW: 16.1 % — ABNORMAL HIGH (ref 11.5–15.5)
WBC Count: 7.6 10*3/uL (ref 4.0–10.5)
nRBC: 0 % (ref 0.0–0.2)

## 2023-04-12 LAB — IRON AND IRON BINDING CAPACITY (CC-WL,HP ONLY)
Iron: 81 ug/dL (ref 28–170)
Saturation Ratios: 20 % (ref 10.4–31.8)
TIBC: 412 ug/dL (ref 250–450)
UIBC: 331 ug/dL (ref 148–442)

## 2023-04-12 LAB — FOLATE: Folate: 4.2 ng/mL — ABNORMAL LOW (ref >5.9)

## 2023-04-12 LAB — VITAMIN B12: Vitamin B-12: 162 pg/mL — ABNORMAL LOW (ref 180–914)

## 2023-04-12 NOTE — Progress Notes (Signed)
[facility] [phone]   Fax:([phone]  OFFICE PROGRESS NOTE  Rema Fendt, NP [address] [place] [address]  DIAGNOSIS: persistent anemia secondary to menstrual blood loss with insufficient dietary supplements. 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  She also has folate and vitamin B12 deficiency.  PRIOR THERAPY: Iron vision with Venofer 300 Mg IV weekly for 3 weeks.  CURRENT THERAPY: Oral vitamin B12 and folic acid supplements  INTERVAL HISTORY: Sonya Barton 32 y.o. female returns to the clinic today for follow-up visit. Discussed the use of AI scribe software for clinical note transcription with the patient, who gave verbal consent to proceed.  History of Present Illness   The patient, a 32 year old with a history of iron deficiency anemia and elevated platelets, was last seen approximately three months ago. At that time, she were administered three doses of iron infusion with Venofer. Post-infusion, the patient reported no significant improvement in energy levels and continued to experience fatigue, albeit less severe than before. She denied any recent episodes of dizziness or cravings for ice, which were previously associated with her anemia.  The patient was also found to have low levels of vitamin B12 and folic acid during the last visit. She attempted to take folic acid supplements but discontinued due to constipation. She admitted to not taking the recommended B12 supplements.  In addition to her hematological issues, the patient's blood pressure was noted to be high during the visit. She attributed this to anxiety about the appointment, mentioning that her blood pressure was within normal limits at a dental appointment earlier in the day.  The patient's recent lab results showed an improvement in hemoglobin and hematocrit levels, returning to normal ranges. Her  platelet count, while still elevated, showed a decrease compared to the previous visit. The results of her iron study, ferritin, B12, and folate levels were still pending at the time of the visit.       MEDICAL HISTORY: Past Medical History:  Diagnosis Date   Obesity     ALLERGIES:  has No Known Allergies.  MEDICATIONS:  Current Outpatient Medications  Medication Sig Dispense Refill   cyanocobalamin (VITAMIN B12) 1000 MCG tablet Take 1 tablet (1,000 mcg total) by mouth daily. 30 tablet 3   folic acid (FOLVITE) 1 MG tablet Take 1 tablet (1 mg total) by mouth daily. 30 tablet 4   SIMPESSE 0.15-0.03 &0.01 MG tablet Take 1 tablet by mouth daily.     No current facility-administered medications for this visit.    SURGICAL HISTORY:  Past Surgical History:  Procedure Laterality Date   WISDOM TOOTH EXTRACTION      REVIEW OF SYSTEMS:  A comprehensive review of systems was negative except for: Constitutional: positive for fatigue   PHYSICAL EXAMINATION: General appearance: alert, cooperative, fatigued, and no distress Head: Normocephalic, without obvious abnormality, atraumatic Neck: no adenopathy, no JVD, supple, symmetrical, trachea midline, and thyroid not enlarged, symmetric, no tenderness/mass/nodules Lymph nodes: Cervical, supraclavicular, and axillary nodes normal. Resp: clear to auscultation bilaterally Back: symmetric, no curvature. ROM normal. No CVA tenderness. Cardio: regular rate and rhythm, S1, S2 normal, no murmur, click, rub or gallop GI: soft, non-tender; bowel sounds normal; no masses,  no organomegaly Extremities: extremities normal, atraumatic, no cyanosis or edema  ECOG PERFORMANCE STATUS: 1 - Symptomatic but completely ambulatory  Blood pressure (!) 173/106, pulse 92, temperature 98.2 F (36.8 C), temperature source  Oral, resp. rate 17, height 5\' 2"  (1.575 m), weight (!) 351 lb 1.6 oz (159.3 kg), SpO2 100%.  LABORATORY DATA: Lab Results  Component Value Date    WBC 7.6 04/12/2023   HGB 12.1 04/12/2023   HCT 37.5 04/12/2023   MCV 79.6 (L) 04/12/2023   PLT 446 (H) 04/12/2023      Chemistry      Component Value Date/Time   NA 139 01/09/2023 1056   NA 139 10/30/2022 0000   K 3.9 01/09/2023 1056   CL 109 01/09/2023 1056   CO2 23 01/09/2023 1056   BUN 10 01/09/2023 1056   BUN 11 10/30/2022 0000   CREATININE 0.65 01/09/2023 1056      Component Value Date/Time   CALCIUM 9.1 01/09/2023 1056   ALKPHOS 73 01/09/2023 1056   AST 11 (L) 01/09/2023 1056   ALT 10 01/09/2023 1056   BILITOT 0.4 01/09/2023 1056       RADIOGRAPHIC STUDIES: No results found.  ASSESSMENT AND PLAN: This is a very pleasant 32 years old African-American female with: Assessment and Plan    Iron Deficiency Anemia Improved hemoglobin and hematocrit levels post iron infusion (Venofer). Persistent fatigue but no pica, chest pain, or dyspnea. Pending iron studies. -Encourage iron-rich diet. -Consider additional iron infusion depending on pending iron studies results. -Plan follow-up in 3 months.  Thrombocytosis Decreased platelet count, approaching normal range. -Monitor platelet count.  Vitamin B12 and Folate Deficiency Non-compliance with B12 and folate supplementation due to gastrointestinal side effects. -Encourage patient to take B12 and folate supplements with food to minimize gastrointestinal side effects. -Check pending B12 and folate levels.  Hypertension Elevated blood pressure during visit, possibly due to white coat syndrome. -Monitor blood pressure at home. -Contact primary care provider if consistently elevated.  Follow-up in 3 months.     The patient was advised to call immediately if she has any other concerning symptoms in the interval. The patient voices understanding of current disease status and treatment options and is in agreement with the current care plan.  All questions were answered. The patient knows to call the clinic with any  problems, questions or concerns. We can certainly see the patient much sooner if necessary.  The total time spent in the appointment was 20 minutes.  Disclaimer: This note was dictated with voice recognition software. Similar sounding words can inadvertently be transcribed and may not be corrected upon review.

## 2023-04-13 ENCOUNTER — Encounter: Payer: Self-pay | Admitting: Internal Medicine

## 2023-04-13 LAB — FERRITIN: Ferritin: 61 ng/mL (ref 11–307)

## 2023-04-14 ENCOUNTER — Other Ambulatory Visit (HOSPITAL_COMMUNITY): Payer: Self-pay | Admitting: Physical Medicine and Rehabilitation

## 2023-04-14 ENCOUNTER — Encounter: Payer: Self-pay | Admitting: Medical Oncology

## 2023-04-14 ENCOUNTER — Other Ambulatory Visit: Payer: Self-pay | Admitting: Internal Medicine

## 2023-04-14 DIAGNOSIS — J329 Chronic sinusitis, unspecified: Secondary | ICD-10-CM

## 2023-04-14 MED ORDER — DODEX 1000 MCG/ML IJ SOLN: 1000.0000 ug | INTRAMUSCULAR | 3 refills | Status: DC

## 2023-04-29 ENCOUNTER — Ambulatory Visit (HOSPITAL_COMMUNITY)
Admission: RE | Admit: 2023-04-29 | Discharge: 2023-04-29 | Disposition: A | Discharge status: Home / Self Care | Payer: 59 | Source: Ambulatory Visit | Attending: Physical Medicine and Rehabilitation | Admitting: Physical Medicine and Rehabilitation

## 2023-04-29 ENCOUNTER — Ambulatory Visit (HOSPITAL_COMMUNITY): Payer: 59

## 2023-04-29 ENCOUNTER — Ambulatory Visit (HOSPITAL_BASED_OUTPATIENT_CLINIC_OR_DEPARTMENT_OTHER): Payer: 59

## 2023-04-29 DIAGNOSIS — J329 Chronic sinusitis, unspecified: Secondary | ICD-10-CM | POA: Diagnosis not present

## 2023-04-29 DIAGNOSIS — J342 Deviated nasal septum: Secondary | ICD-10-CM | POA: Diagnosis not present

## 2023-04-29 DIAGNOSIS — M799 Soft tissue disorder, unspecified: Secondary | ICD-10-CM | POA: Diagnosis not present

## 2023-05-21 ENCOUNTER — Telehealth: Payer: 59 | Admitting: Family Medicine

## 2023-05-21 DIAGNOSIS — B9689 Other specified bacterial agents as the cause of diseases classified elsewhere: Secondary | ICD-10-CM | POA: Diagnosis not present

## 2023-05-21 DIAGNOSIS — J019 Acute sinusitis, unspecified: Secondary | ICD-10-CM

## 2023-05-21 MED ORDER — DOXYCYCLINE HYCLATE 100 MG PO TABS: 100.0000 mg | ORAL_TABLET | Freq: Two times a day (BID) | ORAL | 0 refills | Status: AC

## 2023-05-21 NOTE — Patient Instructions (Signed)

## 2023-05-21 NOTE — Progress Notes (Signed)
Virtual Visit Consent   Sonya Barton, you are scheduled for a virtual visit with a [facility] provider today. Just as with appointments in the office, your consent must be obtained to participate. Your consent will be active for this visit and any virtual visit you may have with one of our providers in the next 365 days. If you have a MyChart account, a copy of this consent can be sent to you electronically.  As this is a virtual visit, video technology does not allow for your provider to perform a traditional examination. This may limit your provider's ability to fully assess your condition. If your provider identifies any concerns that need to be evaluated in person or the need to arrange testing (such as labs, EKG, etc.), we will make arrangements to do so. Although advances in technology are sophisticated, we cannot ensure that it will always work on either your end or our end. If the connection with a video visit is poor, the visit may have to be switched to a telephone visit. With either a video or telephone visit, we are not always able to ensure that we have a secure connection.  By engaging in this virtual visit, you consent to the provision of healthcare and authorize for your insurance to be billed (if applicable) for the services provided during this visit. Depending on your insurance coverage, you may receive a charge related to this service.  I need to obtain your verbal consent now. Are you willing to proceed with your visit today? Sonya Barton has provided verbal consent on 05/21/2023 for a virtual visit (video or telephone). Georgana Curio, FNP  Date: 05/21/2023 2:14 PM  Virtual Visit via Video Note   I, Georgana Curio, connected with  Sonya Barton  ([id number], [birth year]) on 05/21/23 at  2:15 PM EDT by a video-enabled telemedicine application and verified that I am speaking with the correct person using two identifiers.  Location: Patient: Virtual Visit Location Patient:  Home Provider: Virtual Visit Location Provider: Home Office   I discussed the limitations of evaluation and management by telemedicine and the availability of in person appointments. The patient expressed understanding and agreed to proceed.    History of Present Illness: Sonya Barton is a 32 y.o. who identifies as a female who was assigned female at birth, and is being seen today for sinus pressure and pain for 4-5 days worsening. Currently on zyrtec and flonase. Mucus is green. No fever. Post nasal drainage worsening. Marland Kitchen  HPI: HPI  Problems:  Patient Active Problem List   Diagnosis Date Noted   Prediabetes 11/02/2022   Contraception management 03/31/2022   Iron deficiency anemia 03/30/2022   Obesity 03/30/2022   Health care maintenance 03/30/2022    Allergies: No Known Allergies Medications:  Current Outpatient Medications:    doxycycline (VIBRA-TABS) 100 MG tablet, Take 1 tablet (100 mg total) by mouth 2 (two) times daily for 10 days., Disp: 20 tablet, Rfl: 0   cyanocobalamin (DODEX) 1000 MCG/ML injection, Inject 1 mL (1,000 mcg total) into the muscle every 30 (thirty) days., Disp: 1 mL, Rfl: 3   folic acid (FOLVITE) 1 MG tablet, Take 1 tablet (1 mg total) by mouth daily., Disp: 30 tablet, Rfl: 4   SIMPESSE 0.15-0.03 &0.01 MG tablet, Take 1 tablet by mouth daily., Disp: , Rfl:   Observations/Objective: Patient is well-developed, well-nourished in no acute distress.  Resting comfortably  at home.  Head is normocephalic, atraumatic.  No labored breathing.  Speech  is clear and coherent with logical content.  Patient is alert and oriented at baseline.    Assessment and Plan: 1. Acute bacterial sinusitis  Increase fluids, humidifier at night, tylenol or ibuprofen as directed, uc if sx persist or worsen. Encouraged to hold antibiotic for 2-3 days and start if sx persist or worsen.   Follow Up Instructions: I discussed the assessment and treatment plan with the patient. The  patient was provided an opportunity to ask questions and all were answered. The patient agreed with the plan and demonstrated an understanding of the instructions.  A copy of instructions were sent to the patient via MyChart unless otherwise noted below.     The patient was advised to call back or seek an in-person evaluation if the symptoms worsen or if the condition fails to improve as anticipated.    Georgana Curio, FNP

## 2023-05-28 DIAGNOSIS — J0191 Acute recurrent sinusitis, unspecified: Secondary | ICD-10-CM | POA: Insufficient documentation

## 2023-06-11 DIAGNOSIS — Z3041 Encounter for surveillance of contraceptive pills: Secondary | ICD-10-CM | POA: Diagnosis not present

## 2023-06-11 DIAGNOSIS — Z124 Encounter for screening for malignant neoplasm of cervix: Secondary | ICD-10-CM | POA: Diagnosis not present

## 2023-06-11 DIAGNOSIS — H52223 Regular astigmatism, bilateral: Secondary | ICD-10-CM | POA: Diagnosis not present

## 2023-06-11 DIAGNOSIS — H5213 Myopia, bilateral: Secondary | ICD-10-CM | POA: Diagnosis not present

## 2023-06-11 DIAGNOSIS — Z113 Encounter for screening for infections with a predominantly sexual mode of transmission: Secondary | ICD-10-CM | POA: Diagnosis not present

## 2023-06-11 DIAGNOSIS — Z01411 Encounter for gynecological examination (general) (routine) with abnormal findings: Secondary | ICD-10-CM | POA: Diagnosis not present

## 2023-07-05 ENCOUNTER — Other Ambulatory Visit: Payer: Self-pay | Admitting: Medical Genetics

## 2023-07-13 ENCOUNTER — Inpatient Hospital Stay (HOSPITAL_BASED_OUTPATIENT_CLINIC_OR_DEPARTMENT_OTHER): Payer: 59 | Admitting: Internal Medicine

## 2023-07-13 ENCOUNTER — Inpatient Hospital Stay: Payer: 59 | Attending: Internal Medicine

## 2023-07-13 VITALS — BP 130/77 | HR 84 | Temp 98.4°F | Resp 18 | Ht 62.0 in | Wt 344.2 lb

## 2023-07-13 DIAGNOSIS — D500 Iron deficiency anemia secondary to blood loss (chronic): Secondary | ICD-10-CM | POA: Insufficient documentation

## 2023-07-13 DIAGNOSIS — D508 Other iron deficiency anemias: Secondary | ICD-10-CM

## 2023-07-13 DIAGNOSIS — D75838 Other thrombocytosis: Secondary | ICD-10-CM | POA: Diagnosis not present

## 2023-07-13 DIAGNOSIS — N920 Excessive and frequent menstruation with regular cycle: Secondary | ICD-10-CM | POA: Diagnosis not present

## 2023-07-13 DIAGNOSIS — E538 Deficiency of other specified B group vitamins: Secondary | ICD-10-CM | POA: Diagnosis not present

## 2023-07-13 LAB — CBC WITH DIFFERENTIAL (CANCER CENTER ONLY)
Abs Immature Granulocytes: 0.01 10*3/uL (ref 0.00–0.07)
Basophils Absolute: 0 10*3/uL (ref 0.0–0.1)
Basophils Relative: 1 %
Eosinophils Absolute: 0.2 10*3/uL (ref 0.0–0.5)
Eosinophils Relative: 3 %
HCT: 38.7 % (ref 36.0–46.0)
Hemoglobin: 12.6 g/dL (ref 12.0–15.0)
Immature Granulocytes: 0 %
Lymphocytes Relative: 40 %
Lymphs Abs: 2.4 10*3/uL (ref 0.7–4.0)
MCH: 26.8 pg (ref 26.0–34.0)
MCHC: 32.6 g/dL (ref 30.0–36.0)
MCV: 82.3 fL (ref 80.0–100.0)
Monocytes Absolute: 0.3 10*3/uL (ref 0.1–1.0)
Monocytes Relative: 5 %
Neutro Abs: 3.1 10*3/uL (ref 1.7–7.7)
Neutrophils Relative %: 51 %
Platelet Count: 428 10*3/uL — ABNORMAL HIGH (ref 150–400)
RBC: 4.7 MIL/uL (ref 3.87–5.11)
RDW: 12.9 % (ref 11.5–15.5)
WBC Count: 6.1 10*3/uL (ref 4.0–10.5)
nRBC: 0 % (ref 0.0–0.2)

## 2023-07-13 LAB — IRON AND IRON BINDING CAPACITY (CC-WL,HP ONLY)
Iron: 114 ug/dL (ref 28–170)
Saturation Ratios: 28 % (ref 10.4–31.8)
TIBC: 409 ug/dL (ref 250–450)
UIBC: 295 ug/dL (ref 148–442)

## 2023-07-13 LAB — FERRITIN: Ferritin: 49 ng/mL (ref 11–307)

## 2023-07-13 NOTE — Progress Notes (Signed)
[facility] [phone]   Fax:([phone]  OFFICE PROGRESS NOTE  Sonya Fendt, NP [address] [place] [address]  DIAGNOSIS: persistent anemia secondary to menstrual blood loss with insufficient dietary supplements. 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  She also has folate and vitamin B12 deficiency.  PRIOR THERAPY: Iron vision with Venofer 300 Mg IV weekly for 3 weeks.  CURRENT THERAPY: Oral vitamin B12 and folic acid supplements  INTERVAL HISTORY: Sonya Barton 32 y.o. female returns to the clinic today for follow-up visit.Discussed the use of AI scribe software for clinical note transcription with the patient, who gave verbal consent to proceed.  History of Present Illness   The patient, a 32 year old with a history of iron deficiency anemia and reactive thrombocytosis, has been under treatment for these conditions with iron infusion, as well as for folic acid and B12 deficiencies. She has been taking B12 supplements, but has discontinued folic acid due to gastrointestinal discomfort. She has not tried multivitamins containing folic acid. She has considered starting half a tablet of folic acid after consulting with a doctor at her workplace.  The patient denies experiencing any symptoms such as fatigue, weakness, or dizzy spells. She also denies any shortness of breath. She has previously received intravenous iron infusions. The patient's recent blood count shows improvement with a hemoglobin level of 12.6 and a hematocrit of 38.7. Her platelet count, although still elevated, has decreased to 428. The patient is awaiting results of her iron levels to determine if further iron infusions are necessary.       MEDICAL HISTORY: Past Medical History:  Diagnosis Date   Obesity     ALLERGIES:  has no known allergies.  MEDICATIONS:  Current Outpatient Medications  Medication  Sig Dispense Refill   cyanocobalamin (DODEX) 1000 MCG/ML injection Inject 1 mL (1,000 mcg total) into the muscle every 30 (thirty) days. 1 mL 3   folic acid (FOLVITE) 1 MG tablet Take 1 tablet (1 mg total) by mouth daily. 30 tablet 4   SIMPESSE 0.15-0.03 &0.01 MG tablet Take 1 tablet by mouth daily.     No current facility-administered medications for this visit.    SURGICAL HISTORY:  Past Surgical History:  Procedure Laterality Date   WISDOM TOOTH EXTRACTION      REVIEW OF SYSTEMS:  A comprehensive review of systems was negative.   PHYSICAL EXAMINATION: General appearance: alert, cooperative, and no distress Head: Normocephalic, without obvious abnormality, atraumatic Neck: no adenopathy, no JVD, supple, symmetrical, trachea midline, and thyroid not enlarged, symmetric, no tenderness/mass/nodules Lymph nodes: Cervical, supraclavicular, and axillary nodes normal. Resp: clear to auscultation bilaterally Back: symmetric, no curvature. ROM normal. No CVA tenderness. Cardio: regular rate and rhythm, S1, S2 normal, no murmur, click, rub or gallop GI: soft, non-tender; bowel sounds normal; no masses,  no organomegaly Extremities: extremities normal, atraumatic, no cyanosis or edema  ECOG PERFORMANCE STATUS: 1 - Symptomatic but completely ambulatory  Blood pressure 130/77, pulse 84, temperature 98.4 F (36.9 C), temperature source Temporal, resp. rate 18, height 5\' 2"  (1.575 m), weight (!) 344 lb 3.2 oz (156.1 kg), SpO2 100%.  LABORATORY DATA: Lab Results  Component Value Date   WBC 6.1 07/13/2023   HGB 12.6 07/13/2023   HCT 38.7 07/13/2023   MCV 82.3 07/13/2023   PLT 428 (H) 07/13/2023      Chemistry      Component Value Date/Time  NA 139 01/09/2023 1056   NA 139 10/30/2022 0000   K 3.9 01/09/2023 1056   CL 109 01/09/2023 1056   CO2 23 01/09/2023 1056   BUN 10 01/09/2023 1056   BUN 11 10/30/2022 0000   CREATININE 0.65 01/09/2023 1056      Component Value Date/Time    CALCIUM 9.1 01/09/2023 1056   ALKPHOS 73 01/09/2023 1056   AST 11 (L) 01/09/2023 1056   ALT 10 01/09/2023 1056   BILITOT 0.4 01/09/2023 1056       RADIOGRAPHIC STUDIES: No results found.  ASSESSMENT AND PLAN: This is a very pleasant 32 years old African-American female with persistent anemia secondary to menstrual blood loss with insufficient dietary supplements. 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  She also has folate and vitamin B12 deficiency. She is status post iron infusion with Venofer 300 Mg IV weekly for 3 weeks.  She is currently on oral vitamin B12 and folic acid supplements    Iron Deficiency Anemia Iron deficiency anemia with improved hemoglobin (12.6) and hematocrit (38.7). Awaiting iron and ferritin levels to determine need for further supplementation. Discussed potential iron infusion if levels are low. Follow-up in six months if levels are normal. - Await iron and ferritin levels - Arrange iron infusion if levels are low - Schedule follow-up in six months unless symptoms worsen  Reactive Thrombocytosis Reactive thrombocytosis secondary to iron deficiency anemia. Platelet count is 428, elevated but decreasing. Monitoring to ensure continued decrease. 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 Discussed starting with half a tablet of folic acid and trying multivitamins containing folic acid with food to minimize side effects. - Start with half a tablet of folic acid - Try multivitamins containing folic acid with food  Vitamin B12 Deficiency Vitamin B12 deficiency managed with supplements. Regular B12 level checks not necessary due to ongoing supplementation. - Continue current B12 supplementation  Follow-up - Schedule follow-up in six months - Advise to call if symptoms such as fatigue, weakness, or dizziness occur.   The patient was  advised to call immediately if she has any other concerning symptoms in the interval. The patient voices understanding of current disease status and treatment options and is in agreement with the current care plan.  All questions were answered. The patient knows to call the clinic with any problems, questions or concerns. We can certainly see the patient much sooner if necessary.  The total time spent in the appointment was 20 minutes.  Disclaimer: This note was dictated with voice recognition software. Similar sounding words can inadvertently be transcribed and may not be corrected upon review.

## 2023-07-29 ENCOUNTER — Ambulatory Visit: Payer: 59 | Admitting: Podiatry

## 2023-09-09 ENCOUNTER — Encounter: Payer: Self-pay | Admitting: Podiatry

## 2023-09-09 ENCOUNTER — Ambulatory Visit (INDEPENDENT_AMBULATORY_CARE_PROVIDER_SITE_OTHER): Payer: 59 | Admitting: Podiatry

## 2023-09-09 DIAGNOSIS — M19072 Primary osteoarthritis, left ankle and foot: Secondary | ICD-10-CM | POA: Diagnosis not present

## 2023-09-09 NOTE — Progress Notes (Signed)
  Subjective:  Patient ID: Sonya Barton, female    DOB: Oct 12, 1990,  MRN: [id number]  Chief Complaint  Patient presents with   Foot Pain    Left foot  Pt stated that she has a place on the top of her foot that comes and goes and she wanted to have it checked she denies any injuries at this time.    33 y.o. female presents with the above complaint.  Patient presents with left dorsal midfoot pain.  Patient states painful to touch.  She is not having any pain today.  She denies any injury has been going for quite some time has progressive gotten worse he goes up and down in size pain scale is 2 out of 10 today.  Guerch sharp shooting in nature   Review of Systems: Negative except as noted in the HPI. Denies N/V/F/Ch.  Past Medical History:  Diagnosis Date   Obesity     Current Outpatient Medications:    cyanocobalamin (DODEX) 1000 MCG/ML injection, Inject 1 mL (1,000 mcg total) into the muscle every 30 (thirty) days., Disp: 1 mL, Rfl: 3   folic acid (FOLVITE) 1 MG tablet, Take 1 tablet (1 mg total) by mouth daily., Disp: 30 tablet, Rfl: 4   SIMPESSE 0.15-0.03 &0.01 MG tablet, Take 1 tablet by mouth daily., Disp: , Rfl:   Social History   Tobacco Use  Smoking Status Never  Smokeless Tobacco Not on file    No Known Allergies Objective:  There were no vitals filed for this visit. There is no height or weight on file to calculate BMI. Constitutional Well developed. Well nourished.  Vascular Dorsalis pedis pulses palpable bilaterally. Posterior tibial pulses palpable bilaterally. Capillary refill normal to all digits.  No cyanosis or clubbing noted. Pedal hair growth normal.  Neurologic Normal speech. Oriented to person, place, and time. Epicritic sensation to light touch grossly present bilaterally.  Dermatologic Nails well groomed and normal in appearance. No open wounds. No skin lesions.  Orthopedic: Pain on palpation left dorsal midfoot jagged edges appreciated  consistent with osteoarthritis of the midfoot.  Negative Tinel's sign of the hallux sign of the dorsal deep peroneal nerve   Radiographs: None Assessment:   1. Arthritis of left midfoot    Plan:  Patient was evaluated and treated and all questions answered.  Left dorsal midfoot arthritis -All questions and concerns were discussed with the patient at this time.  I discussed with the patient that if it continues to bother her I can do a steroid injection which and also discussed shoe gear modification for now we will hold off on the steroid injection as her pain has resolved.  If any foot and ankle issues in the future she will come back and see me.  No follow-ups on file.

## 2023-10-18 ENCOUNTER — Ambulatory Visit (INDEPENDENT_AMBULATORY_CARE_PROVIDER_SITE_OTHER): Admitting: Family

## 2023-10-18 VITALS — BP 149/94 | HR 102 | Temp 97.8°F | Ht 62.0 in | Wt 349.4 lb

## 2023-10-18 DIAGNOSIS — G43909 Migraine, unspecified, not intractable, without status migrainosus: Secondary | ICD-10-CM

## 2023-10-18 DIAGNOSIS — Z6841 Body Mass Index (BMI) 40.0 and over, adult: Secondary | ICD-10-CM

## 2023-10-18 DIAGNOSIS — Z1321 Encounter for screening for nutritional disorder: Secondary | ICD-10-CM

## 2023-10-18 DIAGNOSIS — Z7689 Persons encountering health services in other specified circumstances: Secondary | ICD-10-CM | POA: Diagnosis not present

## 2023-10-18 DIAGNOSIS — R030 Elevated blood-pressure reading, without diagnosis of hypertension: Secondary | ICD-10-CM | POA: Diagnosis not present

## 2023-10-18 MED ORDER — SEMAGLUTIDE-WEIGHT MANAGEMENT 0.25 MG/0.5ML ~~LOC~~ SOAJ: 0.2500 mg | SUBCUTANEOUS | 0 refills | Status: DC

## 2023-10-18 MED ORDER — SUMATRIPTAN SUCCINATE 25 MG PO TABS: ORAL_TABLET | ORAL | 0 refills | Status: AC

## 2023-10-18 NOTE — Progress Notes (Unsigned)
 Patient ID: Sonya Barton, female    DOB: 1990/08/01  MRN: [id number]  CC: Medical Management of Chronic Issues   Subjective: Sonya Barton is a 33 y.o. female who presents for Her concerns today include: ***  Patient Active Problem List   Diagnosis Date Noted   Acute recurrent sinusitis 05/28/2023   Chronic sinusitis 04/12/2023   Laryngopharyngeal reflux (LPR) 04/12/2023   Seasonal allergies 04/12/2023   Prediabetes 11/02/2022   Contraception management 03/31/2022   Iron deficiency anemia 03/30/2022   Obesity 03/30/2022   Health care maintenance 03/30/2022     Current Outpatient Medications on File Prior to Visit  Medication Sig Dispense Refill   cyanocobalamin (DODEX) 1000 MCG/ML injection Inject 1 mL (1,000 mcg total) into the muscle every 30 (thirty) days. 1 mL 3   folic acid (FOLVITE) 1 MG tablet Take 1 tablet (1 mg total) by mouth daily. 30 tablet 4   SIMPESSE 0.15-0.03 &0.01 MG tablet Take 1 tablet by mouth daily.     No current facility-administered medications on file prior to visit.    No Known Allergies  Social History   Socioeconomic History   Marital status: Single    Spouse name: Not on file   Number of children: 0   Years of education: 2 Masters degree   Highest education level: Master's degree (e.g., MA, MS, MEng, MEd, MSW, MBA)  Occupational History   Occupation: receptionist  Tobacco Use   Smoking status: Never   Smokeless tobacco: Not on file  Vaping Use   Vaping status: Never Used  Substance and Sexual Activity   Alcohol use: Never   Drug use: Never   Sexual activity: Not Currently  Other Topics Concern   Not on file  Social History Narrative   Not on file   Social Drivers of Health   Financial Resource Strain: Low Risk  (10/17/2023)   Overall Financial Resource Strain (CARDIA)    Difficulty of Paying Living Expenses: Not very hard  Food Insecurity: Food Insecurity Present (10/17/2023)   Hunger Vital Sign    Worried About  Running Out of Food in the Last Year: Never true    Ran Out of Food in the Last Year: Sometimes true  Transportation Needs: No Transportation Needs (10/17/2023)   PRAPARE - Administrator, Civil Service (Medical): No    Lack of Transportation (Non-Medical): No  Physical Activity: Insufficiently Active (10/17/2023)   Exercise Vital Sign    Days of Exercise per Week: 1 day    Minutes of Exercise per Session: 10 min  Stress: Stress Concern Present (10/17/2023)   [employer] of Occupational Health - Occupational Stress Questionnaire    Feeling of Stress : To some extent  Social Connections: Moderately Isolated (10/17/2023)   Social Connection and Isolation Panel [NHANES]    Frequency of Communication with Friends and Family: More than three times a week    Frequency of Social Gatherings with Friends and Family: Once a week    Attends Religious Services: More than 4 times per year    Active Member of [employer] or Organizations: No    Attends Banker Meetings: Not on file    Marital Status: Never married  Intimate Partner Violence: Not At Risk (01/09/2023)   Humiliation, Afraid, Rape, and Kick questionnaire    Fear of Current or Ex-Partner: No    Emotionally Abused: No    Physically Abused: No    Sexually Abused: No    Family History  Problem Relation Age of Onset   Hypertension Mother    Hypertension Father     Past Surgical History:  Procedure Laterality Date   WISDOM TOOTH EXTRACTION      ROS: Review of Systems Negative except as stated above  PHYSICAL EXAM: There were no vitals taken for this visit.  Physical Exam  {female adult master:310786} {female adult master:310785}     Latest Ref Rng & Units 01/09/2023   10:56 AM 01/01/2023   12:00 AM 10/30/2022   12:00 AM  CMP  Glucose 70 - 99 mg/dL 99   77   BUN 6 - 20 mg/dL 10   11   Creatinine 4.09 - 1.00 mg/dL 8.11   9.14   Sodium 782 - 145 mmol/L 139   139   Potassium 3.5 - 5.1 mmol/L 3.9   4.3    Chloride 98 - 111 mmol/L 109   103   CO2 22 - 32 mmol/L 23   20   Calcium 8.9 - 10.3 mg/dL 9.1   9.3   Total Protein 6.5 - 8.1 g/dL 6.9  6.5    Total Bilirubin 0.3 - 1.2 mg/dL 0.4  0.2    Alkaline Phos 38 - 126 U/L 73  88    AST 15 - 41 U/L 11  8    ALT 0 - 44 U/L 10  12     Lipid Panel     Component Value Date/Time   CHOL 185 10/30/2022 0000   TRIG 58 10/30/2022 0000   HDL 47 10/30/2022 0000   CHOLHDL 3.9 10/30/2022 0000   LDLCALC 127 (H) 10/30/2022 0000    CBC    Component Value Date/Time   WBC 6.1 07/13/2023 0929   RBC 4.70 07/13/2023 0929   HGB 12.6 07/13/2023 0929   HGB 10.8 (L) 01/01/2023 0000   HCT 38.7 07/13/2023 0929   HCT 34.4 01/01/2023 0000   PLT 428 (H) 07/13/2023 0929   PLT 520 (H) 01/01/2023 0000   MCV 82.3 07/13/2023 0929   MCV 76 (L) 01/01/2023 0000   MCH 26.8 07/13/2023 0929   MCHC 32.6 07/13/2023 0929   RDW 12.9 07/13/2023 0929   RDW 15.2 01/01/2023 0000   LYMPHSABS 2.4 07/13/2023 0929   MONOABS 0.3 07/13/2023 0929   EOSABS 0.2 07/13/2023 0929   BASOSABS 0.0 07/13/2023 0929    ASSESSMENT AND PLAN:  There are no diagnoses linked to this encounter.   Patient was given the opportunity to ask questions.  Patient verbalized understanding of the plan and was able to repeat key elements of the plan. Patient was given clear instructions to go to Emergency Department or return to medical center if symptoms don't improve, worsen, or new problems develop.The patient verbalized understanding.   No orders of the defined types were placed in this encounter.    Requested Prescriptions    No prescriptions requested or ordered in this encounter    No follow-ups on file.  Rema Fendt, NP

## 2023-10-18 NOTE — Progress Notes (Unsigned)
 Patient states no other questions to discuss.

## 2023-10-19 ENCOUNTER — Other Ambulatory Visit: Payer: Self-pay | Admitting: Family

## 2023-10-19 ENCOUNTER — Encounter: Payer: Self-pay | Admitting: Family

## 2023-10-19 DIAGNOSIS — E559 Vitamin D deficiency, unspecified: Secondary | ICD-10-CM

## 2023-10-19 LAB — VITAMIN D 25 HYDROXY (VIT D DEFICIENCY, FRACTURES): Vit D, 25-Hydroxy: 7.3 ng/mL — ABNORMAL LOW (ref 30.0–100.0)

## 2023-10-19 LAB — TSH: TSH: 1.07 u[IU]/mL (ref 0.450–4.500)

## 2023-10-19 MED ORDER — VITAMIN D (ERGOCALCIFEROL) 1.25 MG (50000 UNIT) PO CAPS: 50000.0000 [IU] | ORAL_CAPSULE | ORAL | 0 refills | Status: AC

## 2023-10-22 ENCOUNTER — Telehealth: Payer: Self-pay

## 2023-10-22 NOTE — Telephone Encounter (Signed)
 Pt request copy of immunization record; copy of [facility] immunization record given to pt.

## 2023-11-15 ENCOUNTER — Ambulatory Visit: Admitting: Family

## 2023-12-24 ENCOUNTER — Ambulatory Visit: Payer: Self-pay | Admitting: Family

## 2024-01-04 ENCOUNTER — Encounter: Payer: Self-pay | Admitting: Family

## 2024-01-07 ENCOUNTER — Other Ambulatory Visit: Payer: Self-pay | Admitting: Family

## 2024-01-07 DIAGNOSIS — E559 Vitamin D deficiency, unspecified: Secondary | ICD-10-CM

## 2024-01-13 ENCOUNTER — Ambulatory Visit: Payer: 59 | Admitting: Physician Assistant

## 2024-01-13 ENCOUNTER — Other Ambulatory Visit: Payer: 59

## 2024-01-25 ENCOUNTER — Telehealth: Payer: Self-pay | Admitting: Family Medicine

## 2024-01-25 ENCOUNTER — Encounter: Admitting: Family

## 2024-01-25 ENCOUNTER — Telehealth: Payer: Self-pay

## 2024-01-25 DIAGNOSIS — J019 Acute sinusitis, unspecified: Secondary | ICD-10-CM

## 2024-01-25 DIAGNOSIS — B9689 Other specified bacterial agents as the cause of diseases classified elsewhere: Secondary | ICD-10-CM

## 2024-01-25 MED ORDER — AMOXICILLIN-POT CLAVULANATE 875-125 MG PO TABS: 1.0000 | ORAL_TABLET | Freq: Two times a day (BID) | ORAL | 0 refills | Status: AC

## 2024-01-25 NOTE — Patient Instructions (Signed)
  Sonya Barton, thank you for joining Sonya CHRISTELLA Barefoot, NP for today's virtual visit.  While this provider is not your primary care provider (PCP), if your PCP is located in our provider database this encounter information will be shared with them immediately following your visit.   A [facility] MyChart account gives you access to today's visit and all your visits, tests, and labs performed at [facility]  click here if you don't have a [name] account or go to [email or web address]  Consent: (Patient) Sonya Barton provided verbal consent for this virtual visit at the beginning of the encounter.  Current Medications:  Current Outpatient Medications:    amoxicillin -clavulanate (AUGMENTIN ) 875-125 MG tablet, Take 1 tablet by mouth 2 (two) times daily for 7 days., Disp: 14 tablet, Rfl: 0   cyanocobalamin  (DODEX ) 1000 MCG/ML injection, Inject 1 mL (1,000 mcg total) into the muscle every 30 (thirty) days. (Patient not taking: Reported on 10/18/2023), Disp: 1 mL, Rfl: 3   folic acid  (FOLVITE ) 1 MG tablet, Take 1 tablet (1 mg total) by mouth daily. (Patient not taking: Reported on 10/18/2023), Disp: 30 tablet, Rfl: 4   Semaglutide -Weight Management 0.25 MG/0.5ML SOAJ, Inject 0.25 mg into the skin once a week., Disp: 2 mL, Rfl: 0   SIMPESSE 0.15-0.03 &0.01 MG tablet, Take 1 tablet by mouth daily., Disp: , Rfl:    SUMAtriptan  (IMITREX ) 25 MG tablet, Take 25 mg (1 tablet total) by mouth at the start of the headache. May repeat in 2 hours x 1 if headache persists. Max of 2 tablets/24 hours., Disp: 10 tablet, Rfl: 0   Medications ordered in this encounter:  Meds ordered this encounter  Medications   amoxicillin -clavulanate (AUGMENTIN ) 875-125 MG tablet    Sig: Take 1 tablet by mouth 2 (two) times daily for 7 days.    Dispense:  14 tablet    Refill:  0    Supervising Provider:   BLAISE ALEENE Barton [[id number]]     *If you need refills on other medications prior to your next  appointment, please contact your pharmacy*  Follow-Up: Call back or seek an in-person evaluation if the symptoms worsen or if the condition fails to improve as anticipated.  [facility] [phone]  Other Instructions  URI recommendations: - Increased rest - Increasing Fluids - Acetaminophen  / ibuprofen  as needed for fever/pain.  - Salt water gargling, chloraseptic spray and throat lozenges - Mucinex if mucus is present and increasing.  - Saline nasal spray if congestion or if nasal passages feel dry. - Humidifying the air.      If you have been instructed to have an in-person evaluation today at a local Urgent Care facility, please use the link below. It will take you to a list of all of our available [facility], including address, phone number and hours of operation. Please do not delay care.  [facility]  If you or a family member do not have a primary care provider, use the link below to schedule a visit and establish care. When you choose a [facility] primary care physician or advanced practice provider, you gain a long-term partner in health. Find a Primary Care Provider  Learn more about [facility] in-office and virtual care options:  - Get Care Now

## 2024-01-25 NOTE — Progress Notes (Signed)
 Virtual Visit Consent   Sonya Barton, you are scheduled for a virtual visit with a  provider today. Just as with appointments in the office, your consent must be obtained to participate. Your consent will be active for this visit and any virtual visit you may have with one of our providers in the next 365 days. If you have a MyChart account, a copy of this consent can be sent to you electronically.  As this is a virtual visit, video technology does not allow for your provider to perform a traditional examination. This may limit your provider's ability to fully assess your condition. If your provider identifies any concerns that need to be evaluated in person or the need to arrange testing (such as labs, EKG, etc.), we will make arrangements to do so. Although advances in technology are sophisticated, we cannot ensure that it will always work on either your end or our end. If the connection with a video visit is poor, the visit may have to be switched to a telephone visit. With either a video or telephone visit, we are not always able to ensure that we have a secure connection.  By engaging in this virtual visit, you consent to the provision of healthcare and authorize for your insurance to be billed (if applicable) for the services provided during this visit. Depending on your insurance coverage, you may receive a charge related to this service.  I need to obtain your verbal consent now. Are you willing to proceed with your visit today? Sonya Barton has provided verbal consent on 01/25/2024 for a virtual visit (video or telephone). [name], NP  Date: 01/25/2024 3:42 PM   Virtual Visit via Video Note   I, [name], connected with  Sonya Barton  ([id number], 1990-10-04) on 01/25/24 at  3:45 PM EDT by a video-enabled telemedicine application and verified that I am speaking with the correct person using two identifiers.  Location: Patient: Virtual Visit Location Patient:  Home Provider: Virtual Visit Location Provider: Home Office   I discussed the limitations of evaluation and management by telemedicine and the availability of in person appointments. The patient expressed understanding and agreed to proceed.    History of Present Illness: Sonya Barton is a 33 y.o. who identifies as a female who was assigned female at birth, and is being seen today for sinus infection  Onset was last week with congestion and stuffiness Associated symptoms are little ear achy, sinus pressure, headache- sinus, runny nose at times- unable to get much mucus out.  Modifying factors are Denies chest pain, shortness of breath, fevers, chills, sore throat  Chronic sinusitis - has not had one since winter time.    Problems:  Patient Active Problem List   Diagnosis Date Noted   Acute recurrent sinusitis 05/28/2023   Chronic sinusitis 04/12/2023   Laryngopharyngeal reflux (LPR) 04/12/2023   Seasonal allergies 04/12/2023   Prediabetes 11/02/2022   Contraception management 03/31/2022   Iron  deficiency anemia 03/30/2022   Obesity 03/30/2022   Health care maintenance 03/30/2022    Allergies: No Known Allergies Medications:  Current Outpatient Medications:    cyanocobalamin  (DODEX ) 1000 MCG/ML injection, Inject 1 mL (1,000 mcg total) into the muscle every 30 (thirty) days. (Patient not taking: Reported on 10/18/2023), Disp: 1 mL, Rfl: 3   folic acid  (FOLVITE ) 1 MG tablet, Take 1 tablet (1 mg total) by mouth daily. (Patient not taking: Reported on 10/18/2023), Disp: 30 tablet, Rfl: 4  Semaglutide -Weight Management 0.25 MG/0.5ML SOAJ, Inject 0.25 mg into the skin once a week., Disp: 2 mL, Rfl: 0   SIMPESSE 0.15-0.03 &0.01 MG tablet, Take 1 tablet by mouth daily., Disp: , Rfl:    SUMAtriptan  (IMITREX ) 25 MG tablet, Take 25 mg (1 tablet total) by mouth at the start of the headache. May repeat in 2 hours x 1 if headache persists. Max of 2 tablets/24 hours., Disp: 10 tablet, Rfl:  0  Observations/Objective: Patient is well-developed, well-nourished in no acute distress.  Resting comfortably  at home.  Head is normocephalic, atraumatic.  No labored breathing.  Speech is clear and coherent with logical content.  Patient is alert and oriented at baseline.    Assessment and Plan:  1. Acute bacterial sinusitis (Primary)  - amoxicillin -clavulanate (AUGMENTIN ) 875-125 MG tablet; Take 1 tablet by mouth 2 (two) times daily for 7 days.  Dispense: 14 tablet; Refill: 0   URI recommendations: - Increased rest - Increasing Fluids - Acetaminophen  / ibuprofen  as needed for fever/pain.  - Salt water gargling, chloraseptic spray and throat lozenges - Mucinex if mucus is present and increasing.  - Saline nasal spray if congestion or if nasal passages feel dry. - Humidifying the air.   Reviewed side effects, risks and benefits of medication.    Patient acknowledged agreement and understanding of the plan.   Past Medical, Surgical, Social History, Allergies, and Medications have been Reviewed.    Follow Up Instructions: I discussed the assessment and treatment plan with the patient. The patient was provided an opportunity to ask questions and all were answered. The patient agreed with the plan and demonstrated an understanding of the instructions.  A copy of instructions were sent to the patient via MyChart unless otherwise noted below.    The patient was advised to call back or seek an in-person evaluation if the symptoms worsen or if the condition fails to improve as anticipated.    [name], NP

## 2024-02-17 NOTE — Progress Notes (Signed)
 [facility] OFFICE PROGRESS NOTE  Lorren Greig PARAS, NP [address] [place] [address]  DIAGNOSIS: Persistent anemia secondary to menstrual blood loss with insufficient dietary supplements. 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 She also has folate and vitamin B12 deficiency.   PRIOR THERAPY: Iron  vision with Venofer  300 Mg IV weekly for 3 weeks. Most recent dose on 02/12/23  CURRENT THERAPY: Will resume iron  supplements in next few days.   INTERVAL HISTORY: Sonya Barton 33 y.o. female returns to the clinic today for a follow up visit. The patient was last seen by Dr. Sherrod on 07/13/23. She is followed for her history of anemia. She receives IV iron  PRN, the most recent being 02/12/23.She has been taking B12 shots. She experienced gastrointestinal discomfort with folic acid  and has not been taking it. She has not been taking iron  supplements since her iron  levels were up after the infusions.  When she was taking iron  supplements, they caused gastrointestinal discomfort, specifically constipation. She has not tried prescription iron , only over-the-counter options, which also caused issues. She has not experienced heavy menstrual periods, noting that her periods are light and occur once every three months due to birth control.  She denies any major changes in her health since her last visit, although she experienced some fatigue over the past week due to travel. No unusual weakness, dizziness, lightheadedness, shortness of breath, or any visible bleeding or bruising.  Her blood pressure is elevated today. She was asked to keep a log of her BP readings a few months ago by her PCP and she kept a log of her readings which systolic 130's. She has not been keeping up with her BP at home since April.   She is here today for evaluation and repeat blood work.    MEDICAL HISTORY: Past Medical History:  Diagnosis  Date   Obesity     ALLERGIES:  has no known allergies.  MEDICATIONS:  Current Outpatient Medications  Medication Sig Dispense Refill   cyanocobalamin  (DODEX ) 1000 MCG/ML injection Inject 1 mL (1,000 mcg total) into the muscle every 30 (thirty) days. (Patient not taking: Reported on 10/18/2023) 1 mL 3   folic acid  (FOLVITE ) 1 MG tablet Take 1 tablet (1 mg total) by mouth daily. (Patient not taking: Reported on 10/18/2023) 30 tablet 4   Semaglutide -Weight Management 0.25 MG/0.5ML SOAJ Inject 0.25 mg into the skin once a week. 2 mL 0   SIMPESSE 0.15-0.03 &0.01 MG tablet Take 1 tablet by mouth daily.     SUMAtriptan  (IMITREX ) 25 MG tablet Take 25 mg (1 tablet total) by mouth at the start of the headache. May repeat in 2 hours x 1 if headache persists. Max of 2 tablets/24 hours. 10 tablet 0   No current facility-administered medications for this visit.    SURGICAL HISTORY:  Past Surgical History:  Procedure Laterality Date   WISDOM TOOTH EXTRACTION      REVIEW OF SYSTEMS:   Review of Systems  Constitutional: Positive for mild fatigue. Negative for appetite change, chills,  fever and unexpected weight change.  HENT: Negative for mouth sores, nosebleeds, sore throat and trouble swallowing.   Eyes: Negative for eye problems and icterus.  Respiratory: Negative for cough, hemoptysis, shortness of breath and wheezing.   Cardiovascular: Negative for chest pain and leg swelling.  Gastrointestinal: Negative for abdominal pain, constipation, diarrhea, nausea and vomiting.  Genitourinary: Negative for bladder incontinence, difficulty urinating, dysuria,  frequency and hematuria.   Musculoskeletal: Negative for back pain, gait problem, neck pain and neck stiffness.  Skin: Negative for itching and rash.  Neurological: Negative for dizziness, extremity weakness, gait problem, headaches, light-headedness and seizures.  Hematological: Negative for adenopathy. Does not bruise/bleed easily.   Psychiatric/Behavioral: Negative for confusion, depression and sleep disturbance. The patient is not nervous/anxious.     PHYSICAL EXAMINATION:  Blood pressure (!) 188/101, pulse 79, temperature 97.9 F (36.6 C), temperature source Temporal, resp. rate 15, weight (!) 351 lb (159.2 kg), SpO2 100%.  ECOG PERFORMANCE STATUS: 0  Physical Exam  Constitutional: Oriented to person, place, and time and well-developed, well-nourished, and in no distress.  HENT:  Head: Normocephalic and atraumatic.  Mouth/Throat: Oropharynx is clear and moist. No oropharyngeal exudate.  Eyes: Conjunctivae are normal. Right eye exhibits no discharge. Left eye exhibits no discharge. No scleral icterus.  Neck: Normal range of motion. Neck supple.  Cardiovascular: Normal rate, regular rhythm, normal heart sounds and intact distal pulses.   Pulmonary/Chest: Effort normal and breath sounds normal. No respiratory distress. No wheezes. No rales.  Abdominal: Soft. Bowel sounds are normal. Exhibits no distension and no mass. There is no tenderness.  Musculoskeletal: Normal range of motion. Exhibits no edema.  Lymphadenopathy:    No cervical adenopathy.  Neurological: Alert and oriented to person, place, and time. Exhibits normal muscle tone. Gait normal. Coordination normal.  Skin: Skin is warm and dry. No rash noted. Not diaphoretic. No erythema. No pallor.  Psychiatric: Mood, memory and judgment normal.  Vitals reviewed.  LABORATORY DATA: Lab Results  Component Value Date   WBC 7.9 02/22/2024   HGB 12.4 02/22/2024   HCT 38.4 02/22/2024   MCV 81.7 02/22/2024   PLT 424 (H) 02/22/2024      Chemistry      Component Value Date/Time   NA 139 01/09/2023 1056   NA 139 10/30/2022 0000   K 3.9 01/09/2023 1056   CL 109 01/09/2023 1056   CO2 23 01/09/2023 1056   BUN 10 01/09/2023 1056   BUN 11 10/30/2022 0000   CREATININE 0.65 01/09/2023 1056      Component Value Date/Time   CALCIUM 9.1 01/09/2023 1056    ALKPHOS 73 01/09/2023 1056   AST 11 (L) 01/09/2023 1056   ALT 10 01/09/2023 1056   BILITOT 0.4 01/09/2023 1056       RADIOGRAPHIC STUDIES:  No results found.   ASSESSMENT/PLAN:  This is a very pleasant 33 year old African American female with anemia secondary to menstrual blood loss with insufficient dietary supplements. She also has reactive thrombocytosis secondary to IDA. She also has folate and vitamin B12 deficiency.   The patient receives IV iron  infusions with Venofer  300 mg IV every 3 weeks.  Her most recent dose was in July 2024.  She is no longer on B12 and folate.   Labs today include CBC, B12, folate, iron , and ferritin. Her CBC is resulted which does not show anemia. Her Hbg is 12.4 and MCV 81.7. She continues to have reactive thrombocytosis with platelet count of 424, although this is better than her normal.   I will reach out to her with her iron  and B12, and folate results and if she requires resuming her supplements. She is willing to try alterantive iron  supplements such as prescription, liquid, or slow release. If her iron  is low, I will send her integra plus which may be easier on her stomach. If cost prohibitive, then she will try liquid  or slow release. If unable to tolerate, then she can contact our office and we can coordinate iron  infusions if her iron  is significantly low.    I will arrange her follow up based on her lab results.   Elevated blood pressure Elevated blood pressure noted during office visit. No history of hypertension or current use of antihypertensive medication. Blood pressure may be elevated due to white coat syndrome. - Recheck blood pressure before leaving office which was improved but still elevated per HTN guidelines.  - Advise her to monitor blood pressure at home. - If home readings are consistently high, recommend follow-up with family doctor for potential antihypertensive treatment.  The patient was advised to call immediately if she  has any concerning symptoms in the interval. The patient voices understanding of current disease status and treatment options and is in agreement with the current care plan. All questions were answered. The patient knows to call the clinic with any problems, questions or concerns. We can certainly see the patient much sooner if necessary   No orders of the defined types were placed in this encounter.   The total time spent in the appointment was 20-29 minutes  Decarlo Rivet L Chanan Detwiler, PA-C 02/22/24

## 2024-02-21 ENCOUNTER — Ambulatory Visit: Admitting: Internal Medicine

## 2024-02-22 ENCOUNTER — Inpatient Hospital Stay: Payer: Self-pay | Attending: Physician Assistant

## 2024-02-22 ENCOUNTER — Inpatient Hospital Stay (HOSPITAL_BASED_OUTPATIENT_CLINIC_OR_DEPARTMENT_OTHER): Payer: Self-pay | Admitting: Physician Assistant

## 2024-02-22 ENCOUNTER — Ambulatory Visit (INDEPENDENT_AMBULATORY_CARE_PROVIDER_SITE_OTHER): Admitting: Allergy & Immunology

## 2024-02-22 ENCOUNTER — Other Ambulatory Visit: Payer: Self-pay

## 2024-02-22 ENCOUNTER — Encounter: Payer: Self-pay | Admitting: Family

## 2024-02-22 ENCOUNTER — Encounter: Payer: Self-pay | Admitting: Allergy & Immunology

## 2024-02-22 VITALS — BP 151/98 | HR 79 | Temp 97.9°F | Resp 15 | Wt 351.0 lb

## 2024-02-22 VITALS — BP 130/80 | HR 93 | Temp 98.3°F | Resp 18 | Ht 62.21 in | Wt 350.9 lb

## 2024-02-22 DIAGNOSIS — D75838 Other thrombocytosis: Secondary | ICD-10-CM | POA: Diagnosis not present

## 2024-02-22 DIAGNOSIS — B999 Unspecified infectious disease: Secondary | ICD-10-CM

## 2024-02-22 DIAGNOSIS — D500 Iron deficiency anemia secondary to blood loss (chronic): Secondary | ICD-10-CM | POA: Diagnosis present

## 2024-02-22 DIAGNOSIS — E538 Deficiency of other specified B group vitamins: Secondary | ICD-10-CM | POA: Insufficient documentation

## 2024-02-22 DIAGNOSIS — D508 Other iron deficiency anemias: Secondary | ICD-10-CM

## 2024-02-22 DIAGNOSIS — J310 Chronic rhinitis: Secondary | ICD-10-CM | POA: Diagnosis not present

## 2024-02-22 DIAGNOSIS — D509 Iron deficiency anemia, unspecified: Secondary | ICD-10-CM | POA: Diagnosis not present

## 2024-02-22 DIAGNOSIS — N920 Excessive and frequent menstruation with regular cycle: Secondary | ICD-10-CM | POA: Insufficient documentation

## 2024-02-22 DIAGNOSIS — R030 Elevated blood-pressure reading, without diagnosis of hypertension: Secondary | ICD-10-CM | POA: Diagnosis not present

## 2024-02-22 LAB — IRON AND IRON BINDING CAPACITY (CC-WL,HP ONLY)
Iron: 78 ug/dL (ref 28–170)
Saturation Ratios: 20 % (ref 10.4–31.8)
TIBC: 392 ug/dL (ref 250–450)
UIBC: 314 ug/dL (ref 148–442)

## 2024-02-22 LAB — FERRITIN: Ferritin: 92 ng/mL (ref 11–307)

## 2024-02-22 LAB — CBC WITH DIFFERENTIAL (CANCER CENTER ONLY)
Abs Immature Granulocytes: 0.02 K/uL (ref 0.00–0.07)
Basophils Absolute: 0 K/uL (ref 0.0–0.1)
Basophils Relative: 0 %
Eosinophils Absolute: 0.2 K/uL (ref 0.0–0.5)
Eosinophils Relative: 2 %
HCT: 38.4 % (ref 36.0–46.0)
Hemoglobin: 12.4 g/dL (ref 12.0–15.0)
Immature Granulocytes: 0 %
Lymphocytes Relative: 32 %
Lymphs Abs: 2.5 K/uL (ref 0.7–4.0)
MCH: 26.4 pg (ref 26.0–34.0)
MCHC: 32.3 g/dL (ref 30.0–36.0)
MCV: 81.7 fL (ref 80.0–100.0)
Monocytes Absolute: 0.3 K/uL (ref 0.1–1.0)
Monocytes Relative: 4 %
Neutro Abs: 4.9 K/uL (ref 1.7–7.7)
Neutrophils Relative %: 62 %
Platelet Count: 424 K/uL — ABNORMAL HIGH (ref 150–400)
RBC: 4.7 MIL/uL (ref 3.87–5.11)
RDW: 13.2 % (ref 11.5–15.5)
WBC Count: 7.9 K/uL (ref 4.0–10.5)
nRBC: 0 % (ref 0.0–0.2)

## 2024-02-22 LAB — FOLATE: Folate: 7.2 ng/mL (ref >5.9)

## 2024-02-22 LAB — VITAMIN B12: Vitamin B-12: 164 pg/mL — ABNORMAL LOW (ref 180–914)

## 2024-02-22 MED ORDER — AZELASTINE-FLUTICASONE 137-50 MCG/ACT NA SUSP
2.0000 | NASAL | 5 refills | Status: DC
Start: 2024-02-22 — End: 2024-03-02

## 2024-02-22 MED ORDER — CETIRIZINE HCL 10 MG PO TABS: 10.0000 mg | ORAL_TABLET | Freq: Every day | ORAL | 1 refills | Status: DC

## 2024-02-22 NOTE — Progress Notes (Signed)
 NEW PATIENT  Date of Service/Encounter:  02/22/24  Consult requested by: Lorren Greig PARAS, NP   Assessment:   Chronic rhinitis - planning for skin testing at the next visit  Recurrent infections  Plan/Recommendations:   1. Chronic rhinitis - Because of insurance stipulations, we cannot do skin testing on the same day as your first visit. - We are all working to fight this, but for now we need to do two separate visits.  - We will know more after we do testing at the next visit.  - The skin testing visit can be squeezed in at your convenience.  - Then we can make a more full plan to address all of your symptoms. - Be sure to stop your antihistamines for 3 days before this appointment.  - We are going to try Dymista  (contains fluticasone  + azelastine ) to work together to help with your nasal symptoms.  - We may consider adding Singulair (montelukast) at the next visit, but this can cause irritability and bad dreams, so we do not use it first line.   2. Recurrent infections - We may do some testing for your immune system if the testing is not exciting. - But we will hold off until we do the testing.  3. Return in about 1 week (around 02/29/2024) for ALLERGY TESTING (1-55). You can have the follow up appointment with Dr. Iva or a Nurse Practicioner (our Nurse Practitioners are excellent and always have Physician oversight!).    This note in its entirety was forwarded to the Provider who requested this consultation.  Subjective:   Sonya Barton is a 33 y.o. female presenting today for evaluation of  Chief Complaint  Patient presents with   Sinus Problem    Referred for frequent sinus infection    Sonya Barton has a history of the following: Patient Active Problem List   Diagnosis Date Noted   Acute recurrent sinusitis 05/28/2023   Chronic sinusitis 04/12/2023   Laryngopharyngeal reflux (LPR) 04/12/2023   Seasonal allergies 04/12/2023   Prediabetes 11/02/2022    Contraception management 03/31/2022   Iron  deficiency anemia 03/30/2022   Obesity 03/30/2022   Health care maintenance 03/30/2022    History obtained from: chart review and patient.  Discussed the use of AI scribe software for clinical note transcription with the patient and/or guardian, who gave verbal consent to proceed.  Servando GORMAN Kretschmer was referred by Lorren Greig PARAS, NP.     Sonya Barton is a 33 y.o. female presenting for an evaluation of environmental allergies and recurrent sinusitis.  She experiences sinus infections almost every month for the past year. A CT scan of her sinuses revealed a deviated septum. She uses Flonase  and sinus rinses, but notes that sinus rinses sometimes exacerbate her symptoms. She takes cetirizine  daily, which has been helpful, and is trying to wean off it. No use of azelastine  nasal spray and no ocular symptoms like eye itching.  She experiences congestion and drainage, which sometimes leads to ear pain and a sore throat. No history of pneumonia or skin infections, but she mentions a history of boils for which she was prescribed metformin by her OB GYN.  In her childhood, she frequently fell ill with seasonal changes, though she was never formally tested for allergies. She grew up in the area and recalls being told she had seasonal allergies.  She works as a Production designer, theatre/television/film at [employer], assisting patients traveling for medical care. She commutes from her home  to work at the hospital. She has no children and her sister has dogs.     Sinus CT (October 2024): 1. No significant paranasal sinus disease; minimal scattered sinus mucosal thickening. Patent sinus drainage pathways. 2. Rightward nasal septal deviation and spurring. 3. Asymmetric right lateral face skin and subdermal soft tissue thickening. Correlate with physical exam.  Otherwise, there is no history of other atopic diseases, including asthma, food allergies, drug  allergies, stinging insect allergies, eczema, urticaria, or contact dermatitis. There is no significant infectious history. Vaccinations are up to date.    Past Medical History: Patient Active Problem List   Diagnosis Date Noted   Acute recurrent sinusitis 05/28/2023   Chronic sinusitis 04/12/2023   Laryngopharyngeal reflux (LPR) 04/12/2023   Seasonal allergies 04/12/2023   Prediabetes 11/02/2022   Contraception management 03/31/2022   Iron  deficiency anemia 03/30/2022   Obesity 03/30/2022   Health care maintenance 03/30/2022    Medication List:  Allergies as of 02/22/2024   No Known Allergies      Medication List        Accurate as of February 22, 2024  3:15 PM. If you have any questions, ask your nurse or doctor.          Azelastine -Fluticasone  137-50 MCG/ACT Susp Commonly known as: Dymista  Place 2 sprays into both nostrils 1 day or 1 dose. Started by: Marty Morton Shaggy   cetirizine  10 MG tablet Commonly known as: ZYRTEC  Take 1 tablet (10 mg total) by mouth daily. Started by: Marty Morton Shaggy   Dodex  1000 MCG/ML injection Generic drug: cyanocobalamin  Inject 1 mL (1,000 mcg total) into the muscle every 30 (thirty) days.   folic acid  1 MG tablet Commonly known as: FOLVITE  Take 1 tablet (1 mg total) by mouth daily.   Multi For Her Tabs as directed Orally   Semaglutide -Weight Management 0.25 MG/0.5ML Soaj Inject 0.25 mg into the skin once a week.   Simpesse 0.15-0.03 &0.01 MG tablet Generic drug: Levonorgestrel-Ethinyl Estradiol Take 1 tablet by mouth daily.   SUMAtriptan  25 MG tablet Commonly known as: IMITREX  Take 25 mg (1 tablet total) by mouth at the start of the headache. May repeat in 2 hours x 1 if headache persists. Max of 2 tablets/24 hours.        Birth History: non-contributory  Developmental History: non-contributory  Past Surgical History: Past Surgical History:  Procedure Laterality Date   WISDOM TOOTH EXTRACTION        Family History: Family History  Problem Relation Age of Onset   Angioedema Mother    Hypertension Mother    Allergic rhinitis Father    Hypertension Father    Eczema Sister      Social History: Sonya Barton lives at home in a house that is 33 years old.  There is 1 in the main living areas and carpeting in bedroom.  She has gas heating and central cooling.  There are no animals inside or outside of the home.  There are no dust mite covers on the bedding.  There is no tobacco exposure.  She currently works in the Production designer, theatre/television/film for the past 3 months.  There is no fume, chemical, or dust exposure.  There is no HEPA filter in the home.  She does not live in an interstate or industrial area.   Review of systems otherwise negative other than that mentioned in the HPI.    Objective:   Blood pressure 130/80, pulse 93, temperature 98.3 F (36.8 C), temperature source Temporal,  resp. rate 18, height 5' 2.21 (1.58 m), weight (!) 350 lb 14.4 oz (159.2 kg), SpO2 99%. Body mass index is 63.76 kg/m.     Physical Exam Constitutional:      Appearance: She is well-developed.  HENT:     Head: Normocephalic and atraumatic.     Right Ear: Tympanic membrane, ear canal and external ear normal. No drainage, swelling or tenderness. Tympanic membrane is not injected, scarred, erythematous, retracted or bulging.     Left Ear: Tympanic membrane, ear canal and external ear normal. No drainage, swelling or tenderness. Tympanic membrane is not injected, scarred, erythematous, retracted or bulging.     Nose: No nasal deformity, septal deviation, mucosal edema or rhinorrhea.     Right Sinus: No maxillary sinus tenderness or frontal sinus tenderness.     Left Sinus: No maxillary sinus tenderness or frontal sinus tenderness.     Mouth/Throat:     Mouth: Mucous membranes are not pale and not dry.     Pharynx: Uvula midline.  Eyes:     General:        Right eye: No discharge.        Left eye:  No discharge.     Conjunctiva/sclera: Conjunctivae normal.     Right eye: Right conjunctiva is not injected. No chemosis.    Left eye: Left conjunctiva is not injected. No chemosis.    Pupils: Pupils are equal, round, and reactive to light.  Cardiovascular:     Rate and Rhythm: Normal rate and regular rhythm.     Heart sounds: Normal heart sounds.  Pulmonary:     Effort: Pulmonary effort is normal. No tachypnea, accessory muscle usage or respiratory distress.     Breath sounds: Normal breath sounds. No wheezing, rhonchi or rales.  Chest:     Chest wall: No tenderness.  Abdominal:     Tenderness: There is no abdominal tenderness. There is no guarding or rebound.  Lymphadenopathy:     Head:     Right side of head: No submandibular, tonsillar or occipital adenopathy.     Left side of head: No submandibular, tonsillar or occipital adenopathy.     Cervical: No cervical adenopathy.  Skin:    Coloration: Skin is not pale.     Findings: No abrasion, erythema, petechiae or rash. Rash is not papular, urticarial or vesicular.  Neurological:     Mental Status: She is alert.      Diagnostic studies:  deferred due to insurance stipulations that require a separate visit for testing          Marty Shaggy, MD [facility] 

## 2024-02-22 NOTE — Patient Instructions (Signed)
 1. Chronic rhinitis - Because of insurance stipulations, we cannot do skin testing on the same day as your first visit. - We are all working to fight this, but for now we need to do two separate visits.  - We will know more after we do testing at the next visit.  - The skin testing visit can be squeezed in at your convenience.  - Then we can make a more full plan to address all of your symptoms. - Be sure to stop your antihistamines for 3 days before this appointment.  - We are going to try Dymista  (contains fluticasone  + azelastine ) to work together to help with your nasal symptoms.  - We may consider adding Singulair (montelukast) at the next visit, but this can cause irritability and bad dreams, so we do not use it first line.   2. Recurrent infections - We may do some testing for your immune system if the testing is not exciting. - But we will hold off until we do the testing.  3. Return in about 1 week (around 02/29/2024) for ALLERGY TESTING (1-55). You can have the follow up appointment with Dr. Iva or a Nurse Practicioner (our Nurse Practitioners are excellent and always have Physician oversight!).    Please inform us  of any Emergency Department visits, hospitalizations, or changes in symptoms. Call us  before going to the ED for breathing or allergy symptoms since we might be able to fit you in for a sick visit. Feel free to contact us  anytime with any questions, problems, or concerns.  It was a pleasure to meet you today!  Websites that have reliable patient information: 1. American Academy of Asthma, Allergy, and Immunology: www.aaaai.org 2. Food Allergy Research and Education (FARE): foodallergy.org 3. Mothers of Asthmatics: http://www.asthmacommunitynetwork.org 4. American College of Allergy, Asthma, and Immunology: www.acaai.org      "Like" us  on Facebook and Instagram for our latest updates!      A healthy democracy works best when [employer] participate! Make  sure you are registered to vote! If you have moved or changed any of your contact information, you will need to get this updated before voting! Scan the QR codes below to learn more!        Allergy Shots  Allergies are the result of a chain reaction that starts in the immune system. Your immune system controls how your body defends itself. For instance, if you have an allergy to pollen, your immune system identifies pollen as an invader or allergen. Your immune system overreacts by producing antibodies called Immunoglobulin E (IgE). These antibodies travel to cells that release chemicals, causing an allergic reaction.  The concept behind allergy immunotherapy, whether it is received in the form of shots or tablets, is that the immune system can be desensitized to specific allergens that trigger allergy symptoms. Although it requires time and patience, the payback can be long-term relief. Allergy injections contain a dilute solution of those substances that you are allergic to based upon your skin testing and allergy history.   How Do Allergy Shots Work?  Allergy shots work much like a vaccine. Your body responds to injected amounts of a particular allergen given in increasing doses, eventually developing a resistance and tolerance to it. Allergy shots can lead to decreased, minimal or no allergy symptoms.  There generally are two phases: build-up and maintenance. Build-up often ranges from three to six months and involves receiving injections with increasing amounts of the allergens. The shots are typically given once  or twice a week, though more rapid build-up schedules are sometimes used.  The maintenance phase begins when the most effective dose is reached. This dose is different for each person, depending on how allergic you are and your response to the build-up injections. Once the maintenance dose is reached, there are longer periods between injections, typically two to four  weeks.  Occasionally doctors give cortisone-type shots that can temporarily reduce allergy symptoms. These types of shots are different and should not be confused with allergy immunotherapy shots.  Who Can Be Treated with Allergy Shots?  Allergy shots may be a good treatment approach for people with allergic rhinitis (hay fever), allergic asthma, conjunctivitis (eye allergy) or stinging insect allergy.   Before deciding to begin allergy shots, you should consider:   The length of allergy season and the severity of your symptoms  Whether medications and/or changes to your environment can control your symptoms  Your desire to avoid long-term medication use  Time: allergy immunotherapy requires a major time commitment  Cost: may vary depending on your insurance coverage  Allergy shots for children age 39 and older are effective and often well tolerated. They might prevent the onset of new allergen sensitivities or the progression to asthma.  Allergy shots are not started on patients who are pregnant but can be continued on patients who become pregnant while receiving them. In some patients with other medical conditions or who take certain common medications, allergy shots may be of risk. It is important to mention other medications you talk to your allergist.   What are the two types of build-ups offered:   RUSH or Rapid Desensitization -- one day of injections lasting from 8:30-4:30pm, injections every 1 hour.  Approximately half of the build-up process is completed in that one day.  The following week, normal build-up is resumed, and this entails ~16 visits either weekly or twice weekly, until reaching your "maintenance dose" which is continued weekly until eventually getting spaced out to every month for a duration of 3 to 5 years. The regular build-up appointments are nurse visits where the injections are administered, followed by required monitoring for 30 minutes.    Traditional  build-up -- weekly visits for 6 -12 months until reaching "maintenance dose", then continue weekly until eventually spacing out to every 4 weeks as above. At these appointments, the injections are administered, followed by required monitoring for 30 minutes.     Either way is acceptable, and both are equally effective. With the rush protocol, the advantage is that less time is spent here for injections overall AND you would also reach maintenance dosing faster (which is when the clinical benefit starts to become more apparent). Not everyone is a candidate for rapid desensitization.   IF we proceed with the RUSH protocol, there are premedications which must be taken the day before and the day after the rush only (this includes antihistamines, steroids, and Singulair).  After the rush day, no prednisone or Singulair is required, and we just recommend antihistamines taken on your injection day.  What Is An Estimate of the Costs?  If you are interested in starting allergy injections, please check with your insurance company about your coverage for both allergy vial sets and allergy injections.  Please do so prior to making the appointment to start injections.  The following are CPT codes to give to your insurance company. These are the amounts we BILL to the insurance company, but the amount YOU WILL PAY and WE RECEIVE  IS SUBSTANTIALLY LESS and depends on the contracts we have with different insurance companies.   Amount Billed to Insurance One allergy vial set  CPT 95165   $ 1200     Two allergy vial set  CPT 95165   $ 2400     Three allergy vial set  CPT 95165   $ 3600     One injection   CPT 95115   $ 35  Two injections   CPT 95117   $ 40 RUSH (Rapid Desensitization) CPT 95180 x 8 hours $500/hour  Regarding the allergy injections, your co-pay may or may not apply with each injection, so please confirm this with your insurance company. When you start allergy injections, 1 or 2 sets of vials are made  based on your allergies.  Not all patients can be on one set of vials. A set of vials lasts 6 months to a year depending on how quickly you can proceed with your build-up of your allergy injections. Vials are personalized for each patient depending on their specific allergens.  How often are allergy injection given during the build-up period?   Injections are given at least weekly during the build-up period until your maintenance dose is achieved. Per the doctor's discretion, you may have the option of getting allergy injections two times per week during the build-up period. However, there must be at least 48 hours between injections. The build-up period is usually completed within 6-12 months depending on your ability to schedule injections and for adjustments for reactions. When maintenance dose is reached, your injection schedule is gradually changed to every two weeks and later to every three weeks. Injections will then continue every 4 weeks. Usually, injections are continued for a total of 3-5 years.   When Will I Feel Better?  Some may experience decreased allergy symptoms during the build-up phase. For others, it may take as long as 12 months on the maintenance dose. If there is no improvement after a year of maintenance, your allergist will discuss other treatment options with you.  If you aren't responding to allergy shots, it may be because there is not enough dose of the allergen in your vaccine or there are missing allergens that were not identified during your allergy testing. Other reasons could be that there are high levels of the allergen in your environment or major exposure to non-allergic triggers like tobacco smoke.  What Is the Length of Treatment?  Once the maintenance dose is reached, allergy shots are generally continued for three to five years. The decision to stop should be discussed with your allergist at that time. Some people may experience a permanent reduction of allergy  symptoms. Others may relapse and a longer course of allergy shots can be considered.  What Are the Possible Reactions?  The two types of adverse reactions that can occur with allergy shots are local and systemic. Common local reactions include very mild redness and swelling at the injection site, which can happen immediately or several hours after. Report a delayed reaction from your last injection. These include arm swelling or runny nose, watery eyes or cough that occurs within 12-24 hours after injection. A systemic reaction, which is less common, affects the entire body or a particular body system. They are usually mild and typically respond quickly to medications. Signs include increased allergy symptoms such as sneezing, a stuffy nose or hives.   Rarely, a serious systemic reaction called anaphylaxis can develop. Symptoms include swelling in the throat, wheezing,  a feeling of tightness in the chest, nausea or dizziness. Most serious systemic reactions develop within 30 minutes of allergy shots. This is why it is strongly recommended you wait in your doctor's office for 30 minutes after your injections. Your allergist is trained to watch for reactions, and his or her staff is trained and equipped with the proper medications to identify and treat them.   Report to the nurse immediately if you experience any of the following symptoms: swelling, itching or redness of the skin, hives, watery eyes/nose, breathing difficulty, excessive sneezing, coughing, stomach pain, diarrhea, or light headedness. These symptoms may occur within 15-20 minutes after injection and may require medication.   Who Should Administer Allergy Shots?  The preferred location for receiving shots is your prescribing allergist's office. Injections can sometimes be given at another facility where the physician and staff are trained to recognize and treat reactions, and have received instructions by your prescribing allergist.  What  if I am late for an injection?   Injection dose will be adjusted depending upon how many days or weeks you are late for your injection.   What if I am sick?   Please report any illness to the nurse before receiving injections. She may adjust your dose or postpone injections depending on your symptoms. If you have fever, flu, sinus infection or chest congestion it is best to postpone allergy injections until you are better. Never get an allergy injection if your asthma is causing you problems. If your symptoms persist, seek out medical care to get your health problem under control.  What If I am or Become Pregnant:  Women that become pregnant should schedule an appointment with [facility] before receiving any further allergy injections.

## 2024-02-22 NOTE — Progress Notes (Signed)
 Erroneous encounter-disregard

## 2024-02-23 ENCOUNTER — Other Ambulatory Visit (HOSPITAL_COMMUNITY): Payer: Self-pay

## 2024-02-23 ENCOUNTER — Telehealth: Payer: Self-pay

## 2024-02-23 ENCOUNTER — Other Ambulatory Visit: Payer: Self-pay | Admitting: Physician Assistant

## 2024-02-23 ENCOUNTER — Encounter: Payer: Self-pay | Admitting: Physician Assistant

## 2024-02-23 DIAGNOSIS — E538 Deficiency of other specified B group vitamins: Secondary | ICD-10-CM | POA: Insufficient documentation

## 2024-02-23 DIAGNOSIS — D509 Iron deficiency anemia, unspecified: Secondary | ICD-10-CM

## 2024-02-23 NOTE — Telephone Encounter (Signed)
*  [facility]  Pharmacy Patient Advocate Encounter   Received notification from Fax that prior authorization for Dymista   is required/requested.   Insurance verification completed.   The patient is insured through [facility] .   Per test claim: Refill too soon. PA is not needed at this time. Medication was filled 02/22/2024. Next eligible fill date is 03/16/2024.

## 2024-02-23 NOTE — Telephone Encounter (Signed)
 Spoke with patient regarding lab results. Per Cassie, PA, patient's B12 level is low. Patient reported she has previously received B12 injections at her PCP office.  Offered the option to receive B12 injections either at the cancer center or at her PCP office. Patient agreed to receive the injections at the cancer center but requested that appointments be scheduled on Saturdays due to her work schedule.  Informed patient that the scheduling team will reach out to arrange future appointments. She voiced understanding.

## 2024-02-24 ENCOUNTER — Telehealth: Payer: Self-pay | Admitting: Internal Medicine

## 2024-02-24 NOTE — Telephone Encounter (Signed)
Scheduled appointments with the patient

## 2024-02-26 ENCOUNTER — Inpatient Hospital Stay

## 2024-02-26 VITALS — BP 194/105 | HR 82 | Temp 97.4°F | Resp 16

## 2024-02-26 DIAGNOSIS — D500 Iron deficiency anemia secondary to blood loss (chronic): Secondary | ICD-10-CM | POA: Diagnosis not present

## 2024-02-26 DIAGNOSIS — E538 Deficiency of other specified B group vitamins: Secondary | ICD-10-CM

## 2024-02-26 MED ORDER — CYANOCOBALAMIN 1000 MCG/ML IJ SOLN
1000.0000 ug | Freq: Once | INTRAMUSCULAR | Status: AC
Start: 2024-02-26 — End: 2024-02-26
  Administered 2024-02-26: 1000 ug via INTRAMUSCULAR

## 2024-03-02 ENCOUNTER — Ambulatory Visit (INDEPENDENT_AMBULATORY_CARE_PROVIDER_SITE_OTHER): Admitting: Allergy & Immunology

## 2024-03-02 DIAGNOSIS — J302 Other seasonal allergic rhinitis: Secondary | ICD-10-CM | POA: Diagnosis not present

## 2024-03-02 DIAGNOSIS — J3089 Other allergic rhinitis: Secondary | ICD-10-CM

## 2024-03-02 DIAGNOSIS — B999 Unspecified infectious disease: Secondary | ICD-10-CM

## 2024-03-02 MED ORDER — CETIRIZINE HCL 10 MG PO TABS: 10.0000 mg | ORAL_TABLET | Freq: Every day | ORAL | 1 refills | Status: AC | PRN

## 2024-03-02 MED ORDER — AZELASTINE-FLUTICASONE 137-50 MCG/ACT NA SUSP: 2.0000 | NASAL | 1 refills | Status: DC

## 2024-03-02 NOTE — Progress Notes (Unsigned)
 FOLLOW UP  Date of Service/Encounter:  03/02/24   Assessment:   Perennial and seasonal allergic rhinitis (grasses, ragweed, weeds, trees, indoor molds, outdoor molds, and dust mites)   Recurrent infections - deferring on lab workup   Plan/Recommendations:   1. Chronic rhinitis - Testing today showed: grasses, ragweed, weeds, trees, indoor molds, outdoor molds, and dust mites - Copy of test results provided.  - Avoidance measures provided. - Continue with: Zyrtec  (cetirizine ) 10mg  tablet once daily and Dymista  (fluticasone /azelastine ) two sprays per nostril 1-2 times daily as needed - You can use an extra dose of the antihistamine, if needed, for breakthrough symptoms.  - Consider nasal saline rinses 1-2 times daily to remove allergens from the nasal cavities as well as help with mucous clearance (this is especially helpful to do before the nasal sprays are given) - I would strongly consider allergy  shots as a means of long-term control. - Allergy  shots re-train and reset the immune system to ignore environmental allergens and decrease the resulting immune response to those allergens (sneezing, itchy watery eyes, runny nose, nasal congestion, etc).    - Allergy  shots improve symptoms in 75-85% of patients.  - We can discuss more at the next appointment if the medications are not working for you.  2. Recurrent infections - But we will hold off until we do the testing since testing was so reactive today.   3. Return in about 3 months or earlier if needed.     Subjective:   Sonya Barton is a 33 y.o. female presenting today for follow up of No chief complaint on file.   Sonya Barton has a history of the following: Patient Active Problem List   Diagnosis Date Noted   B12 deficiency 02/23/2024   Acute recurrent sinusitis 05/28/2023   Chronic sinusitis 04/12/2023   Laryngopharyngeal reflux (LPR) 04/12/2023   Seasonal allergies 04/12/2023   Prediabetes 11/02/2022    Contraception management 03/31/2022   Iron  deficiency anemia 03/30/2022   Obesity 03/30/2022   Health care maintenance 03/30/2022    History obtained from: chart review and patient.  Discussed the use of AI scribe software for clinical note transcription with the patient and/or guardian, who gave verbal consent to proceed.  Naw is a 33 y.o. female presenting for skin testing. She was last seen on August 5th. We could not do testing because her insurance company does not cover testing on the same day as a New Patient visit. She has been off of all antihistamines 3 days in anticipation of the testing.   She has been using cetirizine  for her allergies but continues to experience symptoms despite this treatment. She has not been using nasal sprays as prescribed because they were not available for pickup at the pharmacy. The medication Dymista  was supposed to be filled on February 22, 2024, but she did not receive it. She has not taken any allergy  medication since September.  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 She has previously managed B12 shots at work and is considering similar arrangements for allergy  shots.  Otherwise, there have been no changes to her past medical history, surgical history, family history, or social history.    Review of systems otherwise negative other than that mentioned in the HPI.    Objective:   There were no vitals taken for this visit. There is no height or weight on file to calculate BMI.    Physical exam deferred since this was  a skin testing appointment only.   Diagnostic studies:    Allergy  Studies:     Airborne Adult Perc - 03/02/24 1700     Time Antigen Placed 1755    Allergen Manufacturer Jestine    Location Back    Number of Test 55    Panel 1 Select    1. Control-Buffer 50% Glycerol Negative    2. Control-Histamine 2+    3. Bahia 2+    4. [place] 2+    5. Johnson 3+    6. [place]   Blue 4+    7. Meadow Fescue 4+    8. Perennial Rye 4+    9. Timothy 4+    10. Ragweed Mix 2+    11. Cocklebur 2+    12. Plantain,  English 3+    13. Baccharis 2+    14. Dog Fennel 2+    15. Russian Thistle Negative    16. Lamb's Quarters 2+    17. Sheep Sorrell 2+    18. Rough Pigweed Negative    19. Marsh Elder, Rough 3+    20. Mugwort, Common 3+    21. Box, Elder 2+    22. Cedar, red 2+    23. Sweet Gum 4+    24. Pecan Pollen 2+    25. Pine Mix Negative    26. Walnut, Black Pollen 3+    27. Red Mulberry 3+    28. Ash Mix 3+    29. Birch Mix 2+    30. Beech American 3+    31. Cottonwood, [place] 2+    32. Hickory, White 2+    33. Maple Mix 2+    34. Oak, [place] Mix 4+    35. Sycamore Eastern 3+    36. Alternaria Alternata Negative    37. Cladosporium Herbarum Negative    38. Aspergillus Mix Negative    39. Penicillium Mix Negative    40. Bipolaris Sorokiniana (Helminthosporium) Negative    41. Drechslera Spicifera (Curvularia) Negative    42. Mucor Plumbeus Negative    43. Fusarium Moniliforme 2+    44. Aureobasidium Pullulans (pullulara) Negative    45. Rhizopus Oryzae Negative    46. Botrytis Cinera Negative    47. Epicoccum Nigrum Negative    48. Phoma Betae Negative    49. Dust Mite Mix 2+    50. Cat Hair 10,000 BAU/ml Negative    51.  Dog Epithelia Negative    52. Mixed Feathers Negative    53. Horse Epithelia Negative    54. Cockroach, German Negative    55. Tobacco Leaf Negative          Intradermal - 03/02/24 1823     Time Antigen Placed 0630    Allergen Manufacturer Jestine    Location Arm    Number of Test 7    Control Negative    Mold 1 2+    Mold 2 4+    Mold 3 4+    Cat Negative    Dog Negative    Cockroach Negative            Allergy  testing results were read and interpreted by myself, documented by clinical staff.      Marty Shaggy, MD  [facility] 

## 2024-03-02 NOTE — Patient Instructions (Addendum)
 1. Chronic rhinitis - Testing today showed: grasses, ragweed, weeds, trees, indoor molds, outdoor molds, and dust mites - Copy of test results provided.  - Avoidance measures provided. - Continue with: Zyrtec (cetirizine) 10mg  tablet once daily and Dymista (fluticasone/azelastine) two sprays per nostril 1-2 times daily as needed - You can use an extra dose of the antihistamine, if needed, for breakthrough symptoms.  - Consider nasal saline rinses 1-2 times daily to remove allergens from the nasal cavities as well as help with mucous clearance (this is especially helpful to do before the nasal sprays are given) - I would strongly consider allergy shots as a means of long-term control. - Allergy shots re-train and reset the immune system to ignore environmental allergens and decrease the resulting immune response to those allergens (sneezing, itchy watery eyes, runny nose, nasal congestion, etc).    - Allergy shots improve symptoms in 75-85% of patients.  - We can discuss more at the next appointment if the medications are not working for you.  2. Recurrent infections - But we will hold off until we do the testing since testing was so reactive today.   3. Return in about 3 months or earlier if needed.    Please inform us  of any Emergency Department visits, hospitalizations, or changes in symptoms. Call us  before going to the ED for breathing or allergy symptoms since we might be able to fit you in for a sick visit. Feel free to contact us  anytime with any questions, problems, or concerns.  It was a pleasure to see you again today!  Websites that have reliable patient information: 1. American Academy of Asthma, Allergy, and Immunology: www.aaaai.org 2. Food Allergy Research and Education (FARE): foodallergy.org 3. Mothers of Asthmatics: http://www.asthmacommunitynetwork.org 4. American College of Allergy, Asthma, and Immunology: www.acaai.org      "Like" us  on Facebook and Instagram  for our latest updates!      A healthy democracy works best when [employer] participate! Make sure you are registered to vote! If you have moved or changed any of your contact information, you will need to get this updated before voting! Scan the QR codes below to learn more!      Airborne Adult Perc - 03/02/24 1700     Time Antigen Placed 1755    Allergen Manufacturer Jestine    Location Back    Number of Test 55    Panel 1 Select    1. Control-Buffer 50% Glycerol Negative    2. Control-Histamine 2+    3. [place] 2+    4. [place] 2+    5. Johnson 3+    6. [place]  Blue 4+    7. Meadow Fescue 4+    8. Perennial Rye 4+    9. Timothy 4+    10. Ragweed Mix 2+    11. Cocklebur 2+    12. Plantain,  English 3+    13. Baccharis 2+    14. Dog Fennel 2+    15. Russian Thistle Negative    16. Lamb's Quarters 2+    17. Sheep Sorrell 2+    18. Rough Pigweed Negative    19. Marsh Elder, Rough 3+    20. Mugwort, Common 3+    21. Box, Elder 2+    22. Cedar, red 2+    23. Sweet Gum 4+    24. Pecan Pollen 2+    25. Pine Mix Negative    26. Walnut, Black Pollen 3+    27. Red  Mulberry 3+    28. Ash Mix 3+    29. Birch Mix 2+    30. Beech American 3+    31. Cottonwood, [place] 2+    32. Hickory, White 2+    33. Maple Mix 2+    34. Oak, [place] Mix 4+    35. Sycamore Eastern 3+    36. Alternaria Alternata Negative    37. Cladosporium Herbarum Negative    38. Aspergillus Mix Negative    39. Penicillium Mix Negative    40. Bipolaris Sorokiniana (Helminthosporium) Negative    41. Drechslera Spicifera (Curvularia) Negative    42. Mucor Plumbeus Negative    43. Fusarium Moniliforme 2+    44. Aureobasidium Pullulans (pullulara) Negative    45. Rhizopus Oryzae Negative    46. Botrytis Cinera Negative    47. Epicoccum Nigrum Negative    48. Phoma Betae Negative    49. Dust Mite Mix 2+    50. Cat Hair 10,000 BAU/ml Negative    51.  Dog Epithelia Negative    52. Mixed Feathers Negative     53. Horse Epithelia Negative    54. Cockroach, German Negative    55. Tobacco Leaf Negative          Intradermal - 03/02/24 1823     Time Antigen Placed 0630    Allergen Manufacturer Jestine    Location Arm    Number of Test 7    Control Negative    Mold 1 2+    Mold 2 4+    Mold 3 4+    Cat Negative    Dog Negative    Cockroach Negative           Reducing Pollen Exposure  The American Academy of Allergy, Asthma and Immunology suggests the following steps to reduce your exposure to pollen during allergy seasons.    Do not hang sheets or clothing out to dry; pollen may collect on these items. Do not mow lawns or spend time around freshly cut grass; mowing stirs up pollen. Keep windows closed at night.  Keep car windows closed while driving. Minimize morning activities outdoors, a time when pollen counts are usually at their highest. Stay indoors as much as possible when pollen counts or humidity is high and on windy days when pollen tends to remain in the air longer. Use air conditioning when possible.  Many air conditioners have filters that trap the pollen spores. Use a HEPA room air filter to remove pollen form the indoor air you breathe.  Control of Mold Allergen   Mold and fungi can grow on a variety of surfaces provided certain temperature and moisture conditions exist.  Outdoor molds grow on plants, decaying vegetation and soil.  The major outdoor mold, Alternaria and Cladosporium, are found in very high numbers during hot and dry conditions.  Generally, a late Summer - Fall peak is seen for common outdoor fungal spores.  Rain will temporarily lower outdoor mold spore count, but counts rise rapidly when the rainy period ends.  The most important indoor molds are Aspergillus and Penicillium.  Dark, humid and poorly ventilated basements are ideal sites for mold growth.  The next most common sites of mold growth are the bathroom and the kitchen.  Outdoor (Seasonal) Mold  Control   Use air conditioning and keep windows closed Avoid exposure to decaying vegetation. Avoid leaf raking. Avoid grain handling. Consider wearing a face mask if working in moldy areas.    Indoor (Perennial)  Mold Control    Maintain humidity below 50%. Clean washable surfaces with 5% bleach solution. Remove sources e.g. contaminated carpets.   Control of Dust Mite Allergen    Dust mites play a major role in allergic asthma and rhinitis.  They occur in environments with high humidity wherever human skin is found.  Dust mites absorb humidity from the atmosphere (ie, they do not drink) and feed on organic matter (including shed human and animal skin).  Dust mites are a microscopic type of insect that you cannot see with the naked eye.  High levels of dust mites have been detected from mattresses, pillows, carpets, upholstered furniture, bed covers, clothes, soft toys and any woven material.  The principal allergen of the dust mite is found in its feces.  A gram of dust may contain 1,000 mites and 250,000 fecal particles.  Mite antigen is easily measured in the air during house cleaning activities.  Dust mites do not bite and do not cause harm to humans, other than by triggering allergies/asthma.    Ways to decrease your exposure to dust mites in your home:  Encase mattresses, box springs and pillows with a mite-impermeable barrier or cover   Wash sheets, blankets and drapes weekly in hot water (130 F) with detergent and dry them in a dryer on the hot setting.  Have the room cleaned frequently with a vacuum cleaner and a damp dust-mop.  For carpeting or rugs, vacuuming with a vacuum cleaner equipped with a high-efficiency particulate air (HEPA) filter.  The dust mite allergic individual should not be in a room which is being cleaned and should wait 1 hour after cleaning before going into the room. Do not sleep on upholstered furniture (eg, couches).   If possible removing carpeting,  upholstered furniture and drapery from the home is ideal.  Horizontal blinds should be eliminated in the rooms where the person spends the most time (bedroom, study, television room).  Washable vinyl, roller-type shades are optimal. Remove all non-washable stuffed toys from the bedroom.  Wash stuffed toys weekly like sheets and blankets above.   Reduce indoor humidity to less than 50%.  Inexpensive humidity monitors can be purchased at most hardware stores.  Do not use a humidifier as can make the problem worse and are not recommended.       Allergy Shots  Allergies are the result of a chain reaction that starts in the immune system. Your immune system controls how your body defends itself. For instance, if you have an allergy to pollen, your immune system identifies pollen as an invader or allergen. Your immune system overreacts by producing antibodies called Immunoglobulin E (IgE). These antibodies travel to cells that release chemicals, causing an allergic reaction.  The concept behind allergy immunotherapy, whether it is received in the form of shots or tablets, is that the immune system can be desensitized to specific allergens that trigger allergy symptoms. Although it requires time and patience, the payback can be long-term relief. Allergy injections contain a dilute solution of those substances that you are allergic to based upon your skin testing and allergy history.   How Do Allergy Shots Work?  Allergy shots work much like a vaccine. Your body responds to injected amounts of a particular allergen given in increasing doses, eventually developing a resistance and tolerance to it. Allergy shots can lead to decreased, minimal or no allergy symptoms.  There generally are two phases: build-up and maintenance. Build-up often ranges from three to six months and  involves receiving injections with increasing amounts of the allergens. The shots are typically given once or twice a week, though more  rapid build-up schedules are sometimes used.  The maintenance phase begins when the most effective dose is reached. This dose is different for each person, depending on how allergic you are and your response to the build-up injections. Once the maintenance dose is reached, there are longer periods between injections, typically two to four weeks.  Occasionally doctors give cortisone-type shots that can temporarily reduce allergy symptoms. These types of shots are different and should not be confused with allergy immunotherapy shots.  Who Can Be Treated with Allergy Shots?  Allergy shots may be a good treatment approach for people with allergic rhinitis (hay fever), allergic asthma, conjunctivitis (eye allergy) or stinging insect allergy.   Before deciding to begin allergy shots, you should consider:   The length of allergy season and the severity of your symptoms  Whether medications and/or changes to your environment can control your symptoms  Your desire to avoid long-term medication use  Time: allergy immunotherapy requires a major time commitment  Cost: may vary depending on your insurance coverage  Allergy shots for children age 55 and older are effective and often well tolerated. They might prevent the onset of new allergen sensitivities or the progression to asthma.  Allergy shots are not started on patients who are pregnant but can be continued on patients who become pregnant while receiving them. In some patients with other medical conditions or who take certain common medications, allergy shots may be of risk. It is important to mention other medications you talk to your allergist.   What are the two types of build-ups offered:   RUSH or Rapid Desensitization -- one day of injections lasting from 8:30-4:30pm, injections every 1 hour.  Approximately half of the build-up process is completed in that one day.  The following week, normal build-up is resumed, and this entails ~16  visits either weekly or twice weekly, until reaching your "maintenance dose" which is continued weekly until eventually getting spaced out to every month for a duration of 3 to 5 years. The regular build-up appointments are nurse visits where the injections are administered, followed by required monitoring for 30 minutes.    Traditional build-up -- weekly visits for 6 -12 months until reaching "maintenance dose", then continue weekly until eventually spacing out to every 4 weeks as above. At these appointments, the injections are administered, followed by required monitoring for 30 minutes.     Either way is acceptable, and both are equally effective. With the rush protocol, the advantage is that less time is spent here for injections overall AND you would also reach maintenance dosing faster (which is when the clinical benefit starts to become more apparent). Not everyone is a candidate for rapid desensitization.   IF we proceed with the RUSH protocol, there are premedications which must be taken the day before and the day after the rush only (this includes antihistamines, steroids, and Singulair).  After the rush day, no prednisone or Singulair is required, and we just recommend antihistamines taken on your injection day.  What Is An Estimate of the Costs?  If you are interested in starting allergy injections, please check with your insurance company about your coverage for both allergy vial sets and allergy injections.  Please do so prior to making the appointment to start injections.  The following are CPT codes to give to your insurance company. These are the amounts  we BILL to [employer], but the amount YOU WILL PAY and WE RECEIVE IS SUBSTANTIALLY LESS and depends on the contracts we have with different insurance companies.   Amount Billed to Insurance One allergy vial set  CPT 95165   $ 1200     Two allergy vial set  CPT 95165   $ 2400     Three allergy vial set  CPT 95165   $  3600     One injection   CPT 95115   $ 35  Two injections   CPT 95117   $ 40 RUSH (Rapid Desensitization) CPT 95180 x 8 hours $500/hour  Regarding the allergy injections, your co-pay may or may not apply with each injection, so please confirm this with your insurance company. When you start allergy injections, 1 or 2 sets of vials are made based on your allergies.  Not all patients can be on one set of vials. A set of vials lasts 6 months to a year depending on how quickly you can proceed with your build-up of your allergy injections. Vials are personalized for each patient depending on their specific allergens.  How often are allergy injection given during the build-up period?   Injections are given at least weekly during the build-up period until your maintenance dose is achieved. Per the doctor's discretion, you may have the option of getting allergy injections two times per week during the build-up period. However, there must be at least 48 hours between injections. The build-up period is usually completed within 6-12 months depending on your ability to schedule injections and for adjustments for reactions. When maintenance dose is reached, your injection schedule is gradually changed to every two weeks and later to every three weeks. Injections will then continue every 4 weeks. Usually, injections are continued for a total of 3-5 years.   When Will I Feel Better?  Some may experience decreased allergy symptoms during the build-up phase. For others, it may take as long as 12 months on the maintenance dose. If there is no improvement after a year of maintenance, your allergist will discuss other treatment options with you.  If you aren't responding to allergy shots, it may be because there is not enough dose of the allergen in your vaccine or there are missing allergens that were not identified during your allergy testing. Other reasons could be that there are high levels of the allergen in your  environment or major exposure to non-allergic triggers like tobacco smoke.  What Is the Length of Treatment?  Once the maintenance dose is reached, allergy shots are generally continued for three to five years. The decision to stop should be discussed with your allergist at that time. Some people may experience a permanent reduction of allergy symptoms. Others may relapse and a longer course of allergy shots can be considered.  What Are the Possible Reactions?  The two types of adverse reactions that can occur with allergy shots are local and systemic. Common local reactions include very mild redness and swelling at the injection site, which can happen immediately or several hours after. Report a delayed reaction from your last injection. These include arm swelling or runny nose, watery eyes or cough that occurs within 12-24 hours after injection. A systemic reaction, which is less common, affects the entire body or a particular body system. They are usually mild and typically respond quickly to medications. Signs include increased allergy symptoms such as sneezing, a stuffy nose or hives.   Rarely,  a serious systemic reaction called anaphylaxis can develop. Symptoms include swelling in the throat, wheezing, a feeling of tightness in the chest, nausea or dizziness. Most serious systemic reactions develop within 30 minutes of allergy shots. This is why it is strongly recommended you wait in your doctor's office for 30 minutes after your injections. Your allergist is trained to watch for reactions, and his or her staff is trained and equipped with the proper medications to identify and treat them.   Report to the nurse immediately if you experience any of the following symptoms: swelling, itching or redness of the skin, hives, watery eyes/nose, breathing difficulty, excessive sneezing, coughing, stomach pain, diarrhea, or light headedness. These symptoms may occur within 15-20 minutes after injection and  may require medication.   Who Should Administer Allergy Shots?  The preferred location for receiving shots is your prescribing allergist's office. Injections can sometimes be given at another facility where the physician and staff are trained to recognize and treat reactions, and have received instructions by your prescribing allergist.  What if I am late for an injection?   Injection dose will be adjusted depending upon how many days or weeks you are late for your injection.   What if I am sick?   Please report any illness to the nurse before receiving injections. She may adjust your dose or postpone injections depending on your symptoms. If you have fever, flu, sinus infection or chest congestion it is best to postpone allergy injections until you are better. Never get an allergy injection if your asthma is causing you problems. If your symptoms persist, seek out medical care to get your health problem under control.  What If I am or Become Pregnant:  Women that become pregnant should schedule an appointment with [facility] before receiving any further allergy injections.

## 2024-03-04 ENCOUNTER — Inpatient Hospital Stay

## 2024-03-04 VITALS — BP 145/95 | HR 84 | Temp 97.5°F | Resp 18

## 2024-03-04 DIAGNOSIS — E538 Deficiency of other specified B group vitamins: Secondary | ICD-10-CM

## 2024-03-04 DIAGNOSIS — D500 Iron deficiency anemia secondary to blood loss (chronic): Secondary | ICD-10-CM | POA: Diagnosis not present

## 2024-03-04 MED ORDER — CYANOCOBALAMIN 1000 MCG/ML IJ SOLN
1000.0000 ug | Freq: Once | INTRAMUSCULAR | Status: AC
  Administered 2024-03-04: 1000 ug via INTRAMUSCULAR
  Filled 2024-03-04: qty 1

## 2024-03-07 ENCOUNTER — Encounter: Payer: Self-pay | Admitting: Allergy & Immunology

## 2024-03-11 ENCOUNTER — Inpatient Hospital Stay

## 2024-03-11 VITALS — BP 136/81 | HR 86 | Temp 98.5°F | Resp 18

## 2024-03-11 DIAGNOSIS — D500 Iron deficiency anemia secondary to blood loss (chronic): Secondary | ICD-10-CM | POA: Diagnosis not present

## 2024-03-11 DIAGNOSIS — E538 Deficiency of other specified B group vitamins: Secondary | ICD-10-CM

## 2024-03-11 MED ORDER — CYANOCOBALAMIN 1000 MCG/ML IJ SOLN
1000.0000 ug | Freq: Once | INTRAMUSCULAR | Status: AC
  Administered 2024-03-11: 1000 ug via INTRAMUSCULAR
  Filled 2024-03-11: qty 1

## 2024-03-18 ENCOUNTER — Inpatient Hospital Stay

## 2024-03-18 VITALS — BP 157/89 | HR 90 | Temp 97.5°F | Resp 18

## 2024-03-18 DIAGNOSIS — D500 Iron deficiency anemia secondary to blood loss (chronic): Secondary | ICD-10-CM | POA: Diagnosis not present

## 2024-03-18 DIAGNOSIS — E538 Deficiency of other specified B group vitamins: Secondary | ICD-10-CM

## 2024-03-18 MED ORDER — CYANOCOBALAMIN 1000 MCG/ML IJ SOLN
1000.0000 ug | Freq: Once | INTRAMUSCULAR | Status: AC
  Administered 2024-03-18: 1000 ug via INTRAMUSCULAR
  Filled 2024-03-18: qty 1

## 2024-03-21 ENCOUNTER — Telehealth

## 2024-04-14 ENCOUNTER — Ambulatory Visit (INDEPENDENT_AMBULATORY_CARE_PROVIDER_SITE_OTHER): Admitting: Family

## 2024-04-14 ENCOUNTER — Encounter: Payer: Self-pay | Admitting: Family

## 2024-04-14 VITALS — BP 138/84 | HR 99 | Temp 98.6°F | Resp 16 | Ht 62.21 in | Wt 351.4 lb

## 2024-04-14 DIAGNOSIS — Z0000 Encounter for general adult medical examination without abnormal findings: Secondary | ICD-10-CM

## 2024-04-14 DIAGNOSIS — Z13228 Encounter for screening for other metabolic disorders: Secondary | ICD-10-CM | POA: Diagnosis not present

## 2024-04-14 DIAGNOSIS — E559 Vitamin D deficiency, unspecified: Secondary | ICD-10-CM

## 2024-04-14 DIAGNOSIS — Z0130 Encounter for examination of blood pressure without abnormal findings: Secondary | ICD-10-CM

## 2024-04-14 DIAGNOSIS — Z131 Encounter for screening for diabetes mellitus: Secondary | ICD-10-CM

## 2024-04-14 DIAGNOSIS — Z1329 Encounter for screening for other suspected endocrine disorder: Secondary | ICD-10-CM

## 2024-04-14 DIAGNOSIS — Z7689 Persons encountering health services in other specified circumstances: Secondary | ICD-10-CM

## 2024-04-14 DIAGNOSIS — Z6841 Body Mass Index (BMI) 40.0 and over, adult: Secondary | ICD-10-CM

## 2024-04-14 DIAGNOSIS — Z13220 Encounter for screening for lipoid disorders: Secondary | ICD-10-CM

## 2024-04-14 DIAGNOSIS — Z23 Encounter for immunization: Secondary | ICD-10-CM | POA: Diagnosis not present

## 2024-04-14 DIAGNOSIS — Z130 Encounter for screening for diseases of the blood and blood-forming organs and certain disorders involving the immune mechanism: Secondary | ICD-10-CM

## 2024-04-14 MED ORDER — SEMAGLUTIDE-WEIGHT MANAGEMENT 0.25 MG/0.5ML ~~LOC~~ SOAJ: 0.2500 mg | SUBCUTANEOUS | 0 refills | Status: AC

## 2024-04-14 NOTE — Progress Notes (Signed)
 Patient ID: Sonya Barton, female    DOB: 06-11-1991  MRN: [id number]  CC: Annual Exam  Subjective: Sonya Barton is a 33 y.o. female who presents for annual exam.   Her concerns today include:  - Up to date on cervical cancer screening per Care [facility]aps. - States she never began taking Semaglutide -Weight Management due to health insurance would not cover cost. States she has a new Programmer, applications which should cover cost of the same.  - Vitamin D  lab. - Blood pressure check.  Patient Active Problem List   Diagnosis Date Noted   B12 deficiency 02/23/2024   Acute recurrent sinusitis 05/28/2023   Chronic sinusitis 04/12/2023   Laryngopharyngeal reflux (LPR) 04/12/2023   Seasonal allergies 04/12/2023   Prediabetes 11/02/2022   Contraception management 03/31/2022   Iron  deficiency anemia 03/30/2022   Obesity 03/30/2022   Health care maintenance 03/30/2022     Current Outpatient Medications on File Prior to Visit  Medication Sig Dispense Refill   cetirizine  (ZYRTEC ) 10 MG tablet Take 1 tablet (10 mg total) by mouth daily as needed for allergies. 180 tablet 1   Multiple Vitamins-Minerals (MULTI FOR HER) TABS as directed Orally     SIMPESSE 0.15-0.03 &0.01 MG tablet Take 1 tablet by mouth daily.     Azelastine -Fluticasone  (DYMISTA ) 137-50 MCG/ACT SUSP Place 2 sprays into both nostrils 1 day or 1 dose. (Patient not taking: Reported on 04/14/2024) 69 g 1   SUMAtriptan  (IMITREX ) 25 MG tablet Take 25 mg (1 tablet total) by mouth at the start of the headache. May repeat in 2 hours x 1 if headache persists. Max of 2 tablets/24 hours. (Patient not taking: Reported on 04/14/2024) 10 tablet 0   No current facility-administered medications on file prior to visit.    Not on File  Social History   Socioeconomic History   Marital status: Single    Spouse name: Not on file   Number of children: 0   Years of education: 2 Masters degree   Highest education level: Master's degree (e.g., MA,  MS, MEng, MEd, MSW, MBA)  Occupational History   Occupation: receptionist  Tobacco Use   Smoking status: Never   Smokeless tobacco: Not on file  Vaping Use   Vaping status: Never Used  Substance and Sexual Activity   Alcohol use: Never   Drug use: Never   Sexual activity: Not Currently  Other Topics Concern   Not on file  Social History Narrative   Not on file   Social Drivers of Health   Financial Resource Strain: Low Risk  (04/11/2024)   Overall Financial Resource Strain (CARDIA)    Difficulty of Paying Living Expenses: Not hard at all  Food Insecurity: No Food Insecurity (04/11/2024)   Hunger Vital Sign    Worried About Running Out of Food in the Last Year: Never true    Ran Out of Food in the Last Year: Never true  Transportation Needs: No Transportation Needs (04/11/2024)   PRAPARE - Administrator, Civil Service (Medical): No    Lack of Transportation (Non-Medical): No  Physical Activity: Insufficiently Active (04/11/2024)   Exercise Vital Sign    Days of Exercise per Week: 3 days    Minutes of Exercise per Session: 30 min  Stress: No Stress Concern Present (04/11/2024)   [employer] of Occupational Health - Occupational Stress Questionnaire    Feeling of Stress: Only a little  Social Connections: Moderately Isolated (04/11/2024)   Social Connection and  Isolation Panel    Frequency of Communication with Friends and Family: More than three times a week    Frequency of Social Gatherings with Friends and Family: Twice a week    Attends Religious Services: More than 4 times per year    Active Member of [employer] or Organizations: No    Attends Engineer, structural: Not on file    Marital Status: Never married  Intimate Partner Violence: Not At Risk (01/09/2023)   Humiliation, Afraid, Rape, and Kick questionnaire    Fear of Current or Ex-Partner: No    Emotionally Abused: No    Physically Abused: No    Sexually Abused: No    Family History   Problem Relation Age of Onset   Angioedema Mother    Hypertension Mother    Allergic rhinitis Father    Hypertension Father    Eczema Sister     Past Surgical History:  Procedure Laterality Date   WISDOM TOOTH EXTRACTION      ROS: Review of Systems Negative except as stated above  PHYSICAL EXAM: BP 138/84   Pulse 99   Temp 98.6 F (37 C) (Oral)   Resp 16   Ht 5' 2.21 (1.58 m)   Wt (!) 351 lb 6.4 oz (159.4 kg)   LMP 01/29/2024 (Exact Date)   SpO2 99%   BMI 63.84 kg/m   Physical Exam HENT:     Head: Normocephalic and atraumatic.     Right Ear: Tympanic membrane, ear canal and external ear normal.     Left Ear: Tympanic membrane, ear canal and external ear normal.     Nose: Nose normal.     Mouth/Throat:     Mouth: Mucous membranes are moist.     Pharynx: Oropharynx is clear.  Eyes:     Extraocular Movements: Extraocular movements intact.     Conjunctiva/sclera: Conjunctivae normal.     Pupils: Pupils are equal, round, and reactive to light.  Neck:     Thyroid : No thyroid  mass, thyromegaly or thyroid  tenderness.  Cardiovascular:     Rate and Rhythm: Normal rate and regular rhythm.     Pulses: Normal pulses.     Heart sounds: Normal heart sounds.  Pulmonary:     Effort: Pulmonary effort is normal.     Breath sounds: Normal breath sounds.  Chest:     Comments: Patient declined. Abdominal:     General: Bowel sounds are normal.     Palpations: Abdomen is soft.  Genitourinary:    Comments: Patient declined. Musculoskeletal:        General: Normal range of motion.     Right shoulder: Normal.     Left shoulder: Normal.     Right upper arm: Normal.     Left upper arm: Normal.     Right elbow: Normal.     Left elbow: Normal.     Right forearm: Normal.     Left forearm: Normal.     Right wrist: Normal.     Left wrist: Normal.     Right hand: Normal.     Left hand: Normal.     Cervical back: Normal, normal range of motion and neck supple.     Thoracic  back: Normal.     Lumbar back: Normal.     Right hip: Normal.     Left hip: Normal.     Right upper leg: Normal.     Left upper leg: Normal.     Right knee: Normal.  Left knee: Normal.     Right lower leg: Normal.     Left lower leg: Normal.     Right ankle: Normal.     Left ankle: Normal.     Right foot: Normal.     Left foot: Normal.  Skin:    General: Skin is warm and dry.     Capillary Refill: Capillary refill takes less than 2 seconds.  Neurological:     General: No focal deficit present.     Mental Status: She is alert and oriented to person, place, and time.  Psychiatric:        Mood and Affect: Mood normal.        Behavior: Behavior normal.     ASSESSMENT AND PLAN: 1. Annual physical exam (Primary) - Counseled on 150 minutes of exercise per week as tolerated, healthy eating (including decreased daily intake of saturated fats, cholesterol, added sugars, sodium), STI prevention, and routine healthcare maintenance.  2. Screening for metabolic disorder - Routine screening.  - CMP14+EGFR  3. Screening for deficiency anemia - Routine screening.  - CBC  4. Diabetes mellitus screening - Routine screening.  - Hemoglobin A1c  5. Screening cholesterol level - Routine screening.  - Lipid panel  6. Thyroid  disorder screen - Routine screening.  - TSH  7. Vitamin D  deficiency - Routine screening.  - Vitamin D , 25-hydroxy  8. Encounter for weight management 9. BMI 60.0-69.9, adult (HCC) - Semaglutide -Weight Management as prescribed. Counseled on medication adherence/adverse effects.  - Follow-up with primary provider in 4 weeks or sooner if needed.  - semaglutide -weight management (WEGOVY ) 0.25 MG/0.5ML SOAJ SQ injection; Inject 0.25 mg into the skin once a week.  Dispense: 2 mL; Refill: 0  10. Blood pressure check - Blood pressure normal today in office.  - Follow-up with primary provider as scheduled.   11. Immunization due - Administered.  - Tdap  vaccine greater than or equal to 7yo IM   Patient was given the opportunity to ask questions.  Patient verbalized understanding of the plan and was able to repeat key elements of the plan. Patient was given clear instructions to go to Emergency Department or return to medical center if symptoms don't improve, worsen, or new problems develop.The patient verbalized understanding.   Orders Placed This Encounter  Procedures   Tdap vaccine greater than or equal to 7yo IM   CBC   Lipid panel   CMP14+EGFR   Hemoglobin A1c   TSH   Vitamin D , 25-hydroxy     Requested Prescriptions   Signed Prescriptions Disp Refills   semaglutide -weight management (WEGOVY ) 0.25 MG/0.5ML SOAJ SQ injection 2 mL 0    Sig: Inject 0.25 mg into the skin once a week.    Return in about 1 year (around 04/14/2025) for Physical per patient preference and 4 weeks or next available weight check.  Greig JINNY Drones, NP

## 2024-04-15 LAB — CMP14+EGFR
ALT: 8 IU/L (ref 0–32)
AST: 8 IU/L (ref 0–40)
Albumin: 4.2 g/dL (ref 3.9–4.9)
Alkaline Phosphatase: 100 IU/L (ref 41–116)
BUN/Creatinine Ratio: 20 (ref 9–23)
BUN: 11 mg/dL (ref 6–20)
Bilirubin Total: <0.2 mg/dL (ref 0.0–1.2)
CO2: 20 mmol/L (ref 20–29)
Calcium: 9.5 mg/dL (ref 8.7–10.2)
Chloride: 102 mmol/L (ref 96–106)
Creatinine, Ser: 0.55 mg/dL — ABNORMAL LOW (ref 0.57–1.00)
Globulin, Total: 2.8 g/dL (ref 1.5–4.5)
Glucose: 87 mg/dL (ref 70–99)
Potassium: 4.3 mmol/L (ref 3.5–5.2)
Sodium: 138 mmol/L (ref 134–144)
Total Protein: 7 g/dL (ref 6.0–8.5)
eGFR: 125 mL/min/1.73

## 2024-04-15 LAB — HEMOGLOBIN A1C
Est. average glucose Bld gHb Est-mCnc: 117 mg/dL
Hgb A1c MFr Bld: 5.7 % — ABNORMAL HIGH (ref 4.8–5.6)

## 2024-04-15 LAB — CBC
Hematocrit: 40.2 % (ref 34.0–46.6)
Hemoglobin: 13 g/dL (ref 11.1–15.9)
MCH: 27.4 pg (ref 26.6–33.0)
MCHC: 32.3 g/dL (ref 31.5–35.7)
MCV: 85 fL (ref 79–97)
Platelets: 450 x10E3/uL (ref 150–450)
RBC: 4.75 x10E6/uL (ref 3.77–5.28)
RDW: 12.6 % (ref 11.7–15.4)
WBC: 7.7 x10E3/uL (ref 3.4–10.8)

## 2024-04-15 LAB — LIPID PANEL
Chol/HDL Ratio: 4.1 ratio (ref 0.0–4.4)
Cholesterol, Total: 167 mg/dL (ref 100–199)
HDL: 41 mg/dL (ref >39)
LDL Chol Calc (NIH): 114 mg/dL — ABNORMAL HIGH (ref 0–99)
Triglycerides: 64 mg/dL (ref 0–149)
VLDL Cholesterol Cal: 12 mg/dL (ref 5–40)

## 2024-04-15 LAB — VITAMIN D 25 HYDROXY (VIT D DEFICIENCY, FRACTURES): Vit D, 25-Hydroxy: 23.4 ng/mL — ABNORMAL LOW (ref 30.0–100.0)

## 2024-04-15 LAB — TSH: TSH: 1.01 u[IU]/mL (ref 0.450–4.500)

## 2024-04-17 ENCOUNTER — Ambulatory Visit: Payer: Self-pay | Admitting: Family

## 2024-04-17 DIAGNOSIS — Z13220 Encounter for screening for lipoid disorders: Secondary | ICD-10-CM

## 2024-04-22 ENCOUNTER — Inpatient Hospital Stay: Attending: Orthopaedic Surgery

## 2024-04-22 VITALS — BP 163/94 | HR 85 | Temp 97.9°F | Resp 18

## 2024-04-22 DIAGNOSIS — E538 Deficiency of other specified B group vitamins: Secondary | ICD-10-CM | POA: Insufficient documentation

## 2024-04-22 MED ORDER — CYANOCOBALAMIN 1000 MCG/ML IJ SOLN
1000.0000 ug | Freq: Once | INTRAMUSCULAR | Status: AC
  Administered 2024-04-22: 1000 ug via INTRAMUSCULAR

## 2024-04-28 ENCOUNTER — Other Ambulatory Visit: Payer: Self-pay | Admitting: Medical Genetics

## 2024-04-28 ENCOUNTER — Encounter: Payer: Self-pay | Admitting: *Deleted

## 2024-04-28 DIAGNOSIS — Z006 Encounter for examination for normal comparison and control in clinical research program: Secondary | ICD-10-CM

## 2024-04-29 ENCOUNTER — Telehealth

## 2024-04-29 DIAGNOSIS — B9689 Other specified bacterial agents as the cause of diseases classified elsewhere: Secondary | ICD-10-CM

## 2024-04-29 DIAGNOSIS — J019 Acute sinusitis, unspecified: Secondary | ICD-10-CM

## 2024-05-03 MED ORDER — AMOXICILLIN-POT CLAVULANATE 875-125 MG PO TABS: 1.0000 | ORAL_TABLET | Freq: Two times a day (BID) | ORAL | 0 refills | Status: AC

## 2024-05-03 NOTE — Progress Notes (Signed)

## 2024-05-08 ENCOUNTER — Encounter: Payer: Self-pay | Admitting: Family

## 2024-05-08 ENCOUNTER — Other Ambulatory Visit: Payer: Self-pay

## 2024-05-18 ENCOUNTER — Other Ambulatory Visit: Payer: Self-pay

## 2024-05-19 ENCOUNTER — Telehealth: Payer: Self-pay

## 2024-05-19 ENCOUNTER — Other Ambulatory Visit: Payer: Self-pay

## 2024-05-19 NOTE — Telephone Encounter (Signed)
 Pharmacy Patient Advocate Encounter  Received notification from [employer] that Prior Authorization for WEGOVY  has been APPROVED from 05/18/2024 to 05/18/2025   PA #/Case ID/Reference #: [id number]

## 2024-05-26 ENCOUNTER — Other Ambulatory Visit: Payer: Self-pay | Admitting: Pharmacist

## 2024-05-27 ENCOUNTER — Inpatient Hospital Stay: Attending: Orthopaedic Surgery

## 2024-05-27 DIAGNOSIS — E538 Deficiency of other specified B group vitamins: Secondary | ICD-10-CM | POA: Insufficient documentation

## 2024-05-27 MED ORDER — CYANOCOBALAMIN 1000 MCG/ML IJ SOLN
1000.0000 ug | Freq: Once | INTRAMUSCULAR | Status: AC
  Administered 2024-05-27: 1000 ug via INTRAMUSCULAR
  Filled 2024-05-27: qty 1

## 2024-05-30 ENCOUNTER — Ambulatory Visit: Admitting: Allergy & Immunology

## 2024-05-30 ENCOUNTER — Other Ambulatory Visit

## 2024-06-02 ENCOUNTER — Other Ambulatory Visit

## 2024-06-22 NOTE — Progress Notes (Signed)
 [facility] OFFICE PROGRESS NOTE  Jaycee Greig PARAS, NP [address] [place] [address]  DIAGNOSIS: Persistent anemia secondary to menstrual blood loss with insufficient dietary supplements. 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 She also has folate and vitamin B12 deficiency.   PRIOR THERAPY:  Iron  vision with Venofer  300 Mg IV weekly for 3 weeks. Most recent dose on 02/12/23   CURRENT THERAPY:  Monthly B12 injections   INTERVAL HISTORY: Sonya Barton 33 y.o. female returns to the clinic today for a follow up visit. The patient was last seen by myself on 02/22/24. She is followed for her history of anemia. She receives IV iron  PRN, the most recent being 02/12/23. She has been taking B12 shots. She experienced gastrointestinal discomfort with folic acid  and has not been taking it as well as iron  supplements.    She has not experienced heavy menstrual periods since she is on birth control.   She states her energy is pretty good. She did have some adverse side effects from [facility].   No unusual weakness, dizziness, lightheadedness, shortness of breath, or any visible bleeding or bruising.   She is here today for evaluation and repeat blood work.     MEDICAL HISTORY: Past Medical History:  Diagnosis Date   Obesity    Recurrent upper respiratory infection (URI)     ALLERGIES:  has no known allergies.  MEDICATIONS:  Current Outpatient Medications  Medication Sig Dispense Refill   cetirizine  (ZYRTEC ) 10 MG tablet Take 1 tablet (10 mg total) by mouth daily as needed for allergies. (Patient taking differently: Take 10 mg by mouth daily.) 180 tablet 1   Multiple Vitamins-Minerals (MULTI FOR HER) TABS as directed Orally     semaglutide -weight management (WEGOVY ) 0.25 MG/0.5ML SOAJ SQ injection Inject 0.25 mg into the skin once a week. 2 mL 0   SIMPESSE 0.15-0.03 &0.01 MG tablet Take 1 tablet by mouth daily.      amoxicillin -clavulanate (AUGMENTIN ) 875-125 MG tablet Take 1 tablet by mouth 2 (two) times daily. 14 tablet 0   Azelastine -Fluticasone  (DYMISTA ) 137-50 MCG/ACT SUSP Place 2 sprays into both nostrils 1 day or 1 dose. (Patient not taking: Reported on 04/14/2024) 69 g 1   SUMAtriptan  (IMITREX ) 25 MG tablet Take 25 mg (1 tablet total) by mouth at the start of the headache. May repeat in 2 hours x 1 if headache persists. Max of 2 tablets/24 hours. (Patient not taking: Reported on 06/29/2024) 10 tablet 0   No current facility-administered medications for this visit.   Facility-Administered Medications Ordered in Other Visits  Medication Dose Route Frequency Provider Last Rate Last Admin   cyanocobalamin  (VITAMIN B12) injection 1,000 mcg  1,000 mcg Intramuscular Once Cheyanne Lamison L, PA-C        SURGICAL HISTORY:  Past Surgical History:  Procedure Laterality Date   WISDOM TOOTH EXTRACTION      REVIEW OF SYSTEMS:   Review of Systems  Constitutional: Negative for appetite change, chills, fatigue, fever and unexpected weight change.  HENT:   Negative for mouth sores, nosebleeds, sore throat and trouble swallowing.   Eyes: Negative for eye problems and icterus.  Respiratory: Negative for cough, hemoptysis, shortness of breath and wheezing.   Cardiovascular: Negative for chest pain and leg swelling.  Gastrointestinal: Negative for abdominal pain, constipation, diarrhea, nausea and vomiting.  Genitourinary: Negative for bladder incontinence, difficulty urinating, dysuria, frequency and hematuria.   Musculoskeletal: Negative for back  pain, gait problem, neck pain and neck stiffness.  Skin: Negative for itching and rash.  Neurological: Negative for dizziness, extremity weakness, gait problem, headaches, light-headedness and seizures.  Hematological: Negative for adenopathy. Does not bruise/bleed easily.  Psychiatric/Behavioral: Negative for confusion, depression and sleep disturbance. The  patient is not nervous/anxious.     PHYSICAL EXAMINATION:  Blood pressure (!) 137/94, pulse 91, temperature 98.5 F (36.9 C), temperature source Temporal, resp. rate 18, height 5' 2.21 (1.58 m), weight (!) 358 lb (162.4 kg), SpO2 100%.  ECOG PERFORMANCE STATUS: 0  Physical Exam  Constitutional: Oriented to person, place, and time and well-developed, well-nourished, and in no distress. HENT:  Head: Normocephalic and atraumatic.  Mouth/Throat: Oropharynx is clear and moist. No oropharyngeal exudate.  Eyes: Conjunctivae are normal. Right eye exhibits no discharge. Left eye exhibits no discharge. No scleral icterus.  Neck: Normal range of motion. Neck supple.  Cardiovascular: Normal rate, regular rhythm, normal heart sounds and intact distal pulses.   Pulmonary/Chest: Effort normal and breath sounds normal. No respiratory distress. No wheezes. No rales.  Abdominal: Soft. Bowel sounds are normal. Exhibits no distension and no mass. There is no tenderness.  Musculoskeletal: Normal range of motion. Exhibits no edema.  Lymphadenopathy:    No cervical adenopathy.  Neurological: Alert and oriented to person, place, and time. Exhibits normal muscle tone. Gait normal. Coordination normal.  Skin: Skin is warm and dry. No rash noted. Not diaphoretic. No erythema. No pallor.  Psychiatric: Mood, memory and judgment normal.  Vitals reviewed.  LABORATORY DATA: Lab Results  Component Value Date   WBC 7.8 06/29/2024   HGB 12.6 06/29/2024   HCT 38.8 06/29/2024   MCV 80.5 06/29/2024   PLT 408 (H) 06/29/2024      Chemistry      Component Value Date/Time   NA 138 04/14/2024 1546   K 4.3 04/14/2024 1546   CL 102 04/14/2024 1546   CO2 20 04/14/2024 1546   BUN 11 04/14/2024 1546   CREATININE 0.55 (L) 04/14/2024 1546   CREATININE 0.65 01/09/2023 1056      Component Value Date/Time   CALCIUM 9.5 04/14/2024 1546   ALKPHOS 100 04/14/2024 1546   AST 8 04/14/2024 1546   AST 11 (L) 01/09/2023 1056    ALT 8 04/14/2024 1546   ALT 10 01/09/2023 1056   BILITOT <0.2 04/14/2024 1546   BILITOT 0.4 01/09/2023 1056       RADIOGRAPHIC STUDIES:  No results found.   ASSESSMENT/PLAN:  This is a very pleasant 33 year old African American female with anemia secondary to menstrual blood loss with insufficient dietary supplements. She also has reactive thrombocytosis secondary to IDA. She also has folate and vitamin B12 deficiency.    The patient receives IV iron  infusions with Venofer  300 mg IV every 3 weeks.  Her most recent dose was in July 2024.   She receives B12 injections monthly due to low B12 levels.   Labs today show normal hemoglobin and hematocrit.  Her iron  studies are within normal limits although they are trending downwards.  Her B12 and ferritin are pending.  She will receive her B12 injection as scheduled today and I will tentatively plan on continuing these unless her B12 is high on her pending labs.  I do not think she will require an IV iron  infusion but I will consider it if her ferritin is low.  I will tentatively plan on seeing her back for labs, return visit, and injection in 3 months.  Will send her MyChart message or call her with her results.  The patient was advised to call immediately if she has any concerning symptoms in the interval. The patient voices understanding of current disease status and treatment options and is in agreement with the current care plan. All questions were answered. The patient knows to call the clinic with any problems, questions or concerns. We can certainly see the patient much sooner if necessary  Orders Placed This Encounter  Procedures   CBC with Differential (Cancer Center Only)    Standing Status:   Future    Expected Date:   09/27/2024    Expiration Date:   06/29/2025   Ferritin    Standing Status:   Future    Expected Date:   09/27/2024    Expiration Date:   06/29/2025   CMP (Cancer Center only)    Standing Status:    Future    Expected Date:   09/27/2024    Expiration Date:   06/29/2025   Iron  and Iron  Binding Capacity ([facility] only)    Standing Status:   Future    Expected Date:   09/27/2024    Expiration Date:   06/29/2025   Folate    Standing Status:   Future    Expected Date:   09/27/2024    Expiration Date:   06/29/2025   Vitamin B12    Standing Status:   Future    Expected Date:   09/27/2024    Expiration Date:   06/29/2025     The total time spent in the appointment was 20-29 minutes  Sladen Plancarte L Warda Mcqueary, PA-C 06/29/2024

## 2024-06-29 ENCOUNTER — Inpatient Hospital Stay: Attending: Orthopaedic Surgery

## 2024-06-29 ENCOUNTER — Inpatient Hospital Stay

## 2024-06-29 ENCOUNTER — Inpatient Hospital Stay: Admitting: Physician Assistant

## 2024-06-29 VITALS — BP 137/94 | HR 91 | Temp 98.5°F | Resp 18 | Ht 62.21 in | Wt 358.0 lb

## 2024-06-29 DIAGNOSIS — E538 Deficiency of other specified B group vitamins: Secondary | ICD-10-CM | POA: Diagnosis not present

## 2024-06-29 DIAGNOSIS — D509 Iron deficiency anemia, unspecified: Secondary | ICD-10-CM | POA: Diagnosis not present

## 2024-06-29 DIAGNOSIS — N920 Excessive and frequent menstruation with regular cycle: Secondary | ICD-10-CM | POA: Insufficient documentation

## 2024-06-29 DIAGNOSIS — D500 Iron deficiency anemia secondary to blood loss (chronic): Secondary | ICD-10-CM | POA: Diagnosis present

## 2024-06-29 LAB — FERRITIN: Ferritin: 77 ng/mL (ref 11–307)

## 2024-06-29 LAB — CBC WITH DIFFERENTIAL (CANCER CENTER ONLY)
Abs Immature Granulocytes: 0.02 K/uL (ref 0.00–0.07)
Basophils Absolute: 0 K/uL (ref 0.0–0.1)
Basophils Relative: 1 %
Eosinophils Absolute: 0.2 K/uL (ref 0.0–0.5)
Eosinophils Relative: 2 %
HCT: 38.8 % (ref 36.0–46.0)
Hemoglobin: 12.6 g/dL (ref 12.0–15.0)
Immature Granulocytes: 0 %
Lymphocytes Relative: 32 %
Lymphs Abs: 2.5 K/uL (ref 0.7–4.0)
MCH: 26.1 pg (ref 26.0–34.0)
MCHC: 32.5 g/dL (ref 30.0–36.0)
MCV: 80.5 fL (ref 80.0–100.0)
Monocytes Absolute: 0.5 K/uL (ref 0.1–1.0)
Monocytes Relative: 7 %
Neutro Abs: 4.6 K/uL (ref 1.7–7.7)
Neutrophils Relative %: 58 %
Platelet Count: 408 K/uL — ABNORMAL HIGH (ref 150–400)
RBC: 4.82 MIL/uL (ref 3.87–5.11)
RDW: 13.1 % (ref 11.5–15.5)
WBC Count: 7.8 K/uL (ref 4.0–10.5)
nRBC: 0 % (ref 0.0–0.2)

## 2024-06-29 LAB — IRON AND IRON BINDING CAPACITY (CC-WL,HP ONLY)
Iron: 48 ug/dL (ref 28–170)
Saturation Ratios: 13 % (ref 10.4–31.8)
TIBC: 379 ug/dL (ref 250–450)
UIBC: 331 ug/dL

## 2024-06-29 LAB — VITAMIN B12: Vitamin B-12: 378 pg/mL (ref 180–914)

## 2024-06-29 MED ORDER — CYANOCOBALAMIN 1000 MCG/ML IJ SOLN
1000.0000 ug | Freq: Once | INTRAMUSCULAR | Status: AC
  Administered 2024-06-29: 1000 ug via INTRAMUSCULAR
  Filled 2024-06-29: qty 1

## 2024-06-30 ENCOUNTER — Encounter: Payer: Self-pay | Admitting: Physician Assistant

## 2024-07-10 ENCOUNTER — Encounter: Admitting: Family

## 2024-07-10 NOTE — Progress Notes (Signed)
 Erroneous encounter-disregard

## 2024-07-25 ENCOUNTER — Encounter: Payer: Self-pay | Admitting: Physician Assistant

## 2024-07-28 ENCOUNTER — Encounter: Payer: Self-pay | Admitting: Physician Assistant

## 2024-07-28 ENCOUNTER — Telehealth: Admitting: Physician Assistant

## 2024-07-28 DIAGNOSIS — J019 Acute sinusitis, unspecified: Secondary | ICD-10-CM

## 2024-07-28 DIAGNOSIS — B9789 Other viral agents as the cause of diseases classified elsewhere: Secondary | ICD-10-CM | POA: Diagnosis not present

## 2024-07-28 MED ORDER — FLUTICASONE PROPIONATE 50 MCG/ACT NA SUSP: 2.0000 | Freq: Every day | NASAL | 0 refills | Status: AC

## 2024-07-28 NOTE — Progress Notes (Signed)
 We are sorry that you are not feeling well.  Here is how we plan to help!  Based on what you have shared with me it looks like you have sinusitis.  Sinusitis is inflammation and infection in the sinus cavities of the head.  Based on your presentation I believe you most likely have Acute Viral Sinusitis.This is an infection most likely caused by a virus. There is not specific treatment for viral sinusitis other than to help you with the symptoms until the infection runs its course.  You may use an oral decongestant such as Mucinex D or if you have glaucoma or high blood pressure use plain Mucinex. Saline nasal spray help and can safely be used as often as needed for congestion, I have prescribed: Fluticasone nasal spray two sprays in each nostril once a day  Some authorities believe that zinc sprays or the use of Echinacea may shorten the course of your symptoms.  Sinus infections are not as easily transmitted as other respiratory infection, however we still recommend that you avoid close contact with loved ones, especially the very young and elderly.  Remember to wash your hands thoroughly throughout the day as this is the number one way to prevent the spread of infection!  Home Care: Only take medications as instructed by your medical team. Do not take these medications with alcohol. A steam or ultrasonic humidifier can help congestion.  You can place a towel over your head and breathe in the steam from hot water coming from a faucet. Avoid close contacts especially the very young and the elderly. Cover your mouth when you cough or sneeze. Always remember to wash your hands.  Get Help Right Away If: You develop worsening fever or sinus pain. You develop a severe head ache or visual changes. Your symptoms persist after you have completed your treatment plan.  Make sure you Understand these instructions. Will watch your condition. Will get help right away if you are not doing well or get  worse.  Your e-visit answers were reviewed by a board certified advanced clinical practitioner to complete your personal care plan.  Depending on the condition, your plan could have included both over the counter or prescription medications.  If there is a problem please reply  once you have received a response from your provider.  Your safety is important to us .  If you have drug allergies check your prescription carefully.    You can use MyChart to ask questions about today's visit, request a non-urgent call back, or ask for a work or school excuse for 24 hours related to this e-Visit. If it has been greater than 24 hours you will need to follow up with your provider, or enter a new e-Visit to address those concerns.  You will get an e-mail in the next two days asking about your experience.  I hope that your e-visit has been valuable and will speed your recovery. Thank you for using e-visits.  I have spent 5 minutes in review of e-visit questionnaire, review and updating patient chart, medical decision making and response to patient.   Sonya CHRISTELLA Dickinson, PA-C

## 2024-07-29 ENCOUNTER — Inpatient Hospital Stay

## 2024-07-29 ENCOUNTER — Encounter: Payer: Self-pay | Admitting: Physician Assistant

## 2024-08-01 ENCOUNTER — Ambulatory Visit: Admitting: Allergy & Immunology

## 2024-08-03 ENCOUNTER — Telehealth: Payer: Self-pay | Admitting: Internal Medicine

## 2024-08-03 NOTE — Telephone Encounter (Signed)
 I spoke with patient and she rescheduled injection time on 08/04/2024 from 10:00 am to 8:45 am.

## 2024-08-04 ENCOUNTER — Inpatient Hospital Stay: Payer: Self-pay | Attending: Orthopaedic Surgery

## 2024-08-04 ENCOUNTER — Inpatient Hospital Stay: Payer: Self-pay

## 2024-08-04 DIAGNOSIS — E538 Deficiency of other specified B group vitamins: Secondary | ICD-10-CM | POA: Diagnosis present

## 2024-08-04 MED ORDER — CYANOCOBALAMIN 1000 MCG/ML IJ SOLN
1000.0000 ug | Freq: Once | INTRAMUSCULAR | Status: AC
  Administered 2024-08-04: 1000 ug via INTRAMUSCULAR
  Filled 2024-08-04: qty 1

## 2024-08-05 ENCOUNTER — Inpatient Hospital Stay

## 2024-08-16 ENCOUNTER — Ambulatory Visit: Payer: Self-pay | Admitting: Family

## 2024-09-02 ENCOUNTER — Inpatient Hospital Stay

## 2024-09-09 ENCOUNTER — Inpatient Hospital Stay: Attending: Orthopaedic Surgery

## 2024-09-19 ENCOUNTER — Ambulatory Visit: Admitting: Family

## 2024-09-28 ENCOUNTER — Inpatient Hospital Stay

## 2024-09-28 ENCOUNTER — Inpatient Hospital Stay: Admitting: Internal Medicine

## 2024-10-10 ENCOUNTER — Inpatient Hospital Stay: Admitting: Internal Medicine

## 2024-10-10 ENCOUNTER — Inpatient Hospital Stay

## 2024-10-10 ENCOUNTER — Inpatient Hospital Stay: Attending: Orthopaedic Surgery

## 2024-10-16 ENCOUNTER — Ambulatory Visit: Admitting: Family

## 2024-11-11 ENCOUNTER — Inpatient Hospital Stay: Attending: Orthopaedic Surgery

## 2025-04-16 ENCOUNTER — Encounter: Admitting: Family

## 2025-04-23 ENCOUNTER — Ambulatory Visit: Admitting: Physician Assistant
